# Patient Record
Sex: Male | Born: 1955 | Race: Black or African American | Hispanic: No | Marital: Married | Smoking: Never smoker
Health system: Southern US, Community
[De-identification: ages and names within clinical notes are randomized; demographics above are authoritative.]

## PROBLEM LIST (undated history)

## (undated) DIAGNOSIS — I10 Essential (primary) hypertension: Secondary | ICD-10-CM

## (undated) DIAGNOSIS — I639 Cerebral infarction, unspecified: Secondary | ICD-10-CM

## (undated) HISTORY — PX: NO PAST SURGERIES: SHX2092

## (undated) HISTORY — DX: Essential (primary) hypertension: I10

## (undated) HISTORY — DX: Cerebral infarction, unspecified: I63.9

---

## 2013-06-19 ENCOUNTER — Ambulatory Visit: Payer: Self-pay | Admitting: Family Medicine

## 2013-06-19 VITALS — BP 175/110 | HR 76 | Temp 98.2°F | Resp 16 | Ht 71.0 in | Wt 240.0 lb

## 2013-06-19 DIAGNOSIS — Z0289 Encounter for other administrative examinations: Secondary | ICD-10-CM

## 2013-06-19 DIAGNOSIS — I10 Essential (primary) hypertension: Secondary | ICD-10-CM

## 2013-06-19 MED ORDER — CLONIDINE HCL 0.1 MG PO TABS: 0.1000 mg | ORAL_TABLET | Freq: Two times a day (BID) | ORAL | Status: DC

## 2013-06-19 NOTE — Progress Notes (Signed)
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  Objective:  Normal PE except the BP

## 2013-06-20 ENCOUNTER — Encounter: Payer: Self-pay | Admitting: Family Medicine

## 2013-06-20 ENCOUNTER — Ambulatory Visit (INDEPENDENT_AMBULATORY_CARE_PROVIDER_SITE_OTHER): Payer: Self-pay | Admitting: Family Medicine

## 2013-06-20 VITALS — BP 154/102 | HR 72 | Temp 98.1°F | Resp 16 | Ht 71.0 in | Wt 240.0 lb

## 2013-06-20 DIAGNOSIS — Z0289 Encounter for other administrative examinations: Secondary | ICD-10-CM

## 2013-06-20 DIAGNOSIS — I10 Essential (primary) hypertension: Secondary | ICD-10-CM | POA: Insufficient documentation

## 2013-06-20 NOTE — Progress Notes (Signed)
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 He was given clonidine which he's taken.  Objective: Blood pressure on recheck today was 138/89  Assessment: Patient passes for one year

## 2013-06-24 ENCOUNTER — Encounter: Payer: Self-pay | Admitting: Family Medicine

## 2014-01-04 ENCOUNTER — Ambulatory Visit (INDEPENDENT_AMBULATORY_CARE_PROVIDER_SITE_OTHER): Payer: 59 | Admitting: Neurology

## 2014-01-04 ENCOUNTER — Encounter: Payer: Self-pay | Admitting: Neurology

## 2014-01-04 VITALS — BP 144/100 | HR 76 | Temp 97.2°F | Ht 70.0 in | Wt 242.2 lb

## 2014-01-04 DIAGNOSIS — I619 Nontraumatic intracerebral hemorrhage, unspecified: Secondary | ICD-10-CM

## 2014-01-04 NOTE — Progress Notes (Signed)
NEUROLOGY CONSULTATION NOTE  Jordan Logan MRN: [id number] DOB: 1956-01-04  Referring provider: Dr. Terrace Arabia Primary care provider: Dr. Terrace Arabia  Reason for consult:  Recent stroke  Dear Dr Lonzo Cloud:  Thank you for your kind referral of Jordan Logan for consultation of the above symptoms. Although his history is well known to you, please allow me to reiterate it for the purpose of our medical record. The patient was accompanied to the clinic by his girlfriend who also provides collateral information.   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  He stood up and noticed he could not walk well with his right leg.  He denied clear weakness, numbness, or heaviness of the right leg, just that "it wasn't right."  He also had some right facial weakness that has improved.  He felt a little out of breath.  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  Records from hospitalization unavailable for review.  He was discharged on Lisinopril, which her reports he had ran out of prior to the stroke.  Since then, he feels 95% better, with only tingling in the tips of the 2nd and 3rd digits of the right hand, and decreased strength in the right hand.    He denies any headaches, nausea, vomiting, diplopia, dysarthria, dysphagia, neck/back pain, bowel/bladder dysfunction.  He has intermittent right knee pain.  His BP today is 144/100, which concerns him because Lisinopril dose was increased a few days ago. There is a family history of stroke in his father and paternal uncle, hypertension on his mother side.   Records and images were personally reviewed where  available.   PAST MEDICAL HISTORY: Past Medical History  Diagnosis Date  . Hypertension   . Stroke     PAST SURGICAL HISTORY: History reviewed. No pertinent past surgical history.  MEDICATIONS: Lisinopril/HCTZ 20/12.5mg  BID   ALLERGIES: No Known Allergies  FAMILY HISTORY: Family History  Problem Relation Age of Onset  . Lupus Mother     SOCIAL HISTORY: History   Social History  . Marital Status: Legally Separated    Spouse Name: N/A    Number of Children: N/A  . Years of Education: N/A   Occupational History  . Not on file.   Social History Main Topics  . Smoking status: Never Smoker   . Smokeless tobacco: Not on file  . Alcohol Use: Yes  . Drug Use: No  . Sexual Activity: Not on file   Other Topics Concern  . Not on file   Social History Narrative  . No narrative on file    REVIEW OF SYSTEMS: Constitutional: No fevers, chills, or sweats, no generalized fatigue, change in appetite Eyes: No visual changes, double vision, eye pain Ear, nose and throat: No hearing loss, ear pain, nasal congestion, sore throat Cardiovascular: No chest pain, palpitations Respiratory:  No shortness of breath at rest or with exertion, wheezes GastrointestinaI: No nausea, vomiting, diarrhea, abdominal pain, fecal incontinence Genitourinary:  No dysuria, urinary retention or frequency Musculoskeletal:  No neck pain, back pain Integumentary: No rash, pruritus, skin lesions Neurological: as above Psychiatric: No depression, insomnia, anxiety Endocrine: No palpitations, fatigue, diaphoresis, mood swings, change in  appetite, change in weight, increased thirst Hematologic/Lymphatic:  No anemia, purpura, petechiae. Allergic/Immunologic: no itchy/runny eyes, nasal congestion, recent allergic reactions, rashes  PHYSICAL EXAM: Filed Vitals:   01/04/14 1235  BP: 144/100  Pulse: 76  Temp: 97.2 F (36.2 C)   General: No acute distress Head:  Normocephalic/atraumatic Neck:  supple, no paraspinal tenderness, full range of motion Back: No paraspinal tenderness Heart: regular rate and rhythm Lungs: Clear to auscultation bilaterally. Vascular: No carotid bruits. Skin/Extremities: No rash, no edema Neurological Exam: Mental status: alert and oriented to person, place, and time, no dysarthria or dysphagia, Fund of knowledge is appropriate.  Recent and remote memory are intact.  Attention and concentration are normal.    Able to name objects and repeat phrases. Cranial nerves: CN I: not tested CN II: pupils equal, round and reactive to light, visual fields intact, fundi unremarkable. CN III, IV, VI:  full range of motion, no nystagmus, no ptosis CN V: facial sensation intact CN VII: upper and lower face symmetric CN VIII: hearing intact CN IX, X: gag intact, uvula midline CN XI: sternocleidomastoid and trapezius muscles intact CN XII: tongue midline Bulk & Tone: normal, no fasciculations. Motor: 5/5 throughout except for decreased fine finger movements on the right hand.  No pronator drift. Sensation: decreased cold on the right LE, intact to pin, vibration and joint position sense.  No extinction to double simultaneous stimulation.  Romberg test negative Deep Tendon Reflexes: +1 both UE, +2 right patella, +1 left patella, +2 bilateral ankle jerks.  No ankle clonus Plantar responses: downgoing bilaterally Finger to nose testing: no incoordination Gait: narrow-based and steady, able to tandem walk adequately.  IMPRESSION: This is a pleasant 58 year old old-handed man with a history of hypertension and recent left basal ganglia hemorrhage on 12/15/13 with residual minimal right hand weakness and tingling.  [facility] requested for review.  He is not on lipid lowering agents, presumably levels were normal, however records unavailable at this time.  We discussed the importance of BP control, he will be getting a home BP monitor today, goal <140/90.   A follow-up head CT without contrast will be ordered in 2 weeks.  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  He will be speaking to the DOT and to his HR personnel about this.  He will follow-up in 3 months.  Thank you for allowing me to participate in the care of this patient. Please do not hesitate to call for any questions or concerns.   Patrcia DollyKaren Debra Colon, M.D.  CC: Dr. Terrace ArabiaIbtehal Shamleffer

## 2014-01-04 NOTE — Patient Instructions (Addendum)
1. Schedule repeat head CT without contrast in 2 weeks March 11 @2 :30 arrive 15 minutes prior [id number] 2. Obtain BP monitor and monitor pressures daily, goal is less than 140/90 3. Per CDL driving restrictions, no driving for 1 year after stroke 4. Follow-up in 3 months

## 2014-01-05 ENCOUNTER — Encounter: Payer: Self-pay | Admitting: Neurology

## 2014-01-11 ENCOUNTER — Ambulatory Visit
Admission: RE | Admit: 2014-01-11 | Discharge: 2014-01-11 | Disposition: A | Discharge status: Home / Self Care | Payer: 59 | Source: Ambulatory Visit | Attending: Internal Medicine | Admitting: Internal Medicine

## 2014-01-11 ENCOUNTER — Other Ambulatory Visit: Payer: Self-pay | Admitting: Internal Medicine

## 2014-01-11 DIAGNOSIS — R059 Cough, unspecified: Secondary | ICD-10-CM

## 2014-01-11 DIAGNOSIS — R05 Cough: Secondary | ICD-10-CM

## 2014-01-18 ENCOUNTER — Ambulatory Visit (HOSPITAL_COMMUNITY): Admission: RE | Admit: 2014-01-18 | Payer: 59 | Source: Ambulatory Visit

## 2014-06-27 ENCOUNTER — Encounter (HOSPITAL_COMMUNITY): Payer: Self-pay | Admitting: Emergency Medicine

## 2014-06-27 ENCOUNTER — Emergency Department (HOSPITAL_COMMUNITY): Payer: 59

## 2014-06-27 ENCOUNTER — Emergency Department (HOSPITAL_COMMUNITY)
Admission: EM | Admit: 2014-06-27 | Discharge: 2014-06-27 | Disposition: A | Discharge status: Home / Self Care | Payer: 59 | Attending: Emergency Medicine | Admitting: Emergency Medicine

## 2014-06-27 ENCOUNTER — Ambulatory Visit (INDEPENDENT_AMBULATORY_CARE_PROVIDER_SITE_OTHER): Payer: 59 | Admitting: Family Medicine

## 2014-06-27 VITALS — BP 186/92 | HR 71 | Temp 97.7°F | Resp 16

## 2014-06-27 DIAGNOSIS — Z8673 Personal history of transient ischemic attack (TIA), and cerebral infarction without residual deficits: Secondary | ICD-10-CM

## 2014-06-27 DIAGNOSIS — Z79899 Other long term (current) drug therapy: Secondary | ICD-10-CM | POA: Insufficient documentation

## 2014-06-27 DIAGNOSIS — I10 Essential (primary) hypertension: Secondary | ICD-10-CM | POA: Insufficient documentation

## 2014-06-27 DIAGNOSIS — Z7982 Long term (current) use of aspirin: Secondary | ICD-10-CM | POA: Insufficient documentation

## 2014-06-27 DIAGNOSIS — R209 Unspecified disturbances of skin sensation: Secondary | ICD-10-CM | POA: Insufficient documentation

## 2014-06-27 DIAGNOSIS — R200 Anesthesia of skin: Secondary | ICD-10-CM

## 2014-06-27 DIAGNOSIS — R202 Paresthesia of skin: Secondary | ICD-10-CM

## 2014-06-27 LAB — CBC WITH DIFFERENTIAL/PLATELET
Basophils Absolute: 0 10*3/uL (ref 0.0–0.1)
Basophils Relative: 1 % (ref 0–1)
EOS ABS: 0.1 10*3/uL (ref 0.0–0.7)
Eosinophils Relative: 2 % (ref 0–5)
HCT: 43 % (ref 39.0–52.0)
Hemoglobin: 15 g/dL (ref 13.0–17.0)
Lymphocytes Relative: 41 % (ref 12–46)
Lymphs Abs: 1.3 10*3/uL (ref 0.7–4.0)
MCH: 30.7 pg (ref 26.0–34.0)
MCHC: 34.9 g/dL (ref 30.0–36.0)
MCV: 88.1 fL (ref 78.0–100.0)
Monocytes Absolute: 0.2 10*3/uL (ref 0.1–1.0)
Monocytes Relative: 7 % (ref 3–12)
NEUTROS PCT: 51 % (ref 43–77)
Neutro Abs: 1.6 10*3/uL — ABNORMAL LOW (ref 1.7–7.7)
PLATELETS: 171 10*3/uL (ref 150–400)
RBC: 4.88 MIL/uL (ref 4.22–5.81)
RDW: 14.2 % (ref 11.5–15.5)
WBC: 3.2 10*3/uL — ABNORMAL LOW (ref 4.0–10.5)

## 2014-06-27 LAB — BASIC METABOLIC PANEL
Anion gap: 12 (ref 5–15)
BUN: 10 mg/dL (ref 6–23)
CALCIUM: 9.4 mg/dL (ref 8.4–10.5)
CO2: 27 mEq/L (ref 19–32)
Chloride: 102 mEq/L (ref 96–112)
Creatinine, Ser: 0.87 mg/dL (ref 0.50–1.35)
GFR calc non Af Amer: >90 mL/min (ref >90)
Glucose, Bld: 93 mg/dL (ref 70–99)
POTASSIUM: 4 meq/L (ref 3.7–5.3)
SODIUM: 141 meq/L (ref 137–147)

## 2014-06-27 MED ORDER — LISINOPRIL 10 MG PO TABS: 10.0000 mg | ORAL_TABLET | Freq: Every day | ORAL | Status: DC

## 2014-06-27 NOTE — ED Provider Notes (Signed)
CSN: [id number]     Arrival date & time 06/27/14  1143 History   First MD Initiated Contact with Patient 06/27/14 1151     Chief Complaint  Patient presents with  . Right hand tingling       HPI  Patient presents with some tingling in his right hand. He has a history of stroke in February of this year. Use in [place] when it happened. Dated 3 days. Has a right upper extremity weakness and tingling. Has right facial droop. His symptoms resolved and he has reached baseline and within a month as his initial insult. This morning and yesterday felt some tingling in his right index and middle fingers on the very tip. Difficulty with use or clumsiness of the hand. No facial droop. No speech or swallowing difficulties. He has been off his aspirin for 3 weeks of his lisinopril for 10 days.  Past Medical History  Diagnosis Date  . Hypertension   . Stroke    History reviewed. No pertinent past surgical history. Family History  Problem Relation Age of Onset  . Lupus Mother    History  Substance Use Topics  . Smoking status: Never Smoker   . Smokeless tobacco: Not on file  . Alcohol Use: Yes    Review of Systems  Constitutional: Negative for fever, chills, diaphoresis, appetite change and fatigue.  HENT: Negative for mouth sores, sore throat and trouble swallowing.   Eyes: Negative for visual disturbance.  Respiratory: Negative for cough, chest tightness, shortness of breath and wheezing.   Cardiovascular: Negative for chest pain.  Gastrointestinal: Negative for nausea, vomiting, abdominal pain, diarrhea and abdominal distention.  Endocrine: Negative for polydipsia, polyphagia and polyuria.  Genitourinary: Negative for dysuria, frequency and hematuria.  Musculoskeletal: Negative for gait problem.  Skin: Negative for color change, pallor and rash.  Neurological: Negative for dizziness, syncope, light-headedness and headaches.       Tingling right fingertips  Hematological: Does not  bruise/bleed easily.  Psychiatric/Behavioral: Negative for behavioral problems and confusion.      Allergies  Review of patient's allergies indicates no known allergies.  Home Medications   Prior to Admission medications   Medication Sig Start Date End Date Taking? Authorizing Provider  lisinopril (PRINIVIL,ZESTRIL) 10 MG tablet Take 1 tablet (10 mg total) by mouth daily. 06/27/14   Rolland Porter, MD  lisinopril (PRINIVIL,ZESTRIL) 20 MG tablet Take 20 mg by mouth 2 (two) times daily.    Historical Provider, MD   BP 151/96  Pulse 65  Temp(Src) 97.4 F (36.3 C) (Oral)  Resp 16  SpO2 98% Physical Exam  Constitutional: He is oriented to person, place, and time. He appears well-developed and well-nourished. No distress.  HENT:  Head: Normocephalic.  Eyes: Conjunctivae are normal. Pupils are equal, round, and reactive to light. No scleral icterus.  Neck: Normal range of motion. Neck supple. No thyromegaly present.  Cardiovascular: Normal rate and regular rhythm.  Exam reveals no gallop and no friction rub.   No murmur heard. Pulmonary/Chest: Effort normal and breath sounds normal. No respiratory distress. He has no wheezes. He has no rales.  Abdominal: Soft. Bowel sounds are normal. He exhibits no distension. There is no tenderness. There is no rebound.  Musculoskeletal: Normal range of motion.  Neurological: He is alert and oriented to person, place, and time.  Normal symmetric Strength to shoulder shrug, triceps, biceps, grip,wrist flex/extend,and intrinsics  Norma lsymmetric sensation above and below clavicles, and to all distributions to UEs. Norma symmetric strength to  flex/.extend hip and knees, dorsi/plantar flex ankles. Normal symmetric sensation to all distributions to LEs Patellar and achilles reflexes 1-2+. Downgoing Babinski   Skin: Skin is warm and dry. No rash noted.  Psychiatric: He has a normal mood and affect. His behavior is normal.    ED Course  Procedures  (including critical care time) Labs Review Labs Reviewed  CBC WITH DIFFERENTIAL - Abnormal; Notable for the following:    WBC 3.2 (*)    Neutro Abs 1.6 (*)    All other components within normal limits  BASIC METABOLIC PANEL    Imaging Review Mr Brain Wo Contrast  06/27/2014   CLINICAL DATA:  Right arm numbness.  History of stroke.  EXAM: MRI HEAD WITHOUT CONTRAST  TECHNIQUE: Multiplanar, multiecho pulse sequences of the brain and surrounding structures were obtained without intravenous contrast.  COMPARISON:  None.  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 No significant encephalomalacia. No other areas of hemorrhage  Several very small white matter hyperintensities bilaterally consistent with mild chronic microvascular ischemia. Brainstem and cerebellum intact  Negative for acute ischemic infarction.  Negative for mass  Ventricle size is normal.  Cerebral volume is normal.  IMPRESSION: Chronic hemorrhage left posterior putamen. 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 No acute infarct.   Electronically Signed   By: Marlan Palauharles  Clark M.D.   On: 06/27/2014 13:56     EKG Interpretation   Date/Time:  Tuesday June 27 2014 11:47:58 EDT Ventricular Rate:  69 PR Interval:  149 QRS Duration: 102 QT Interval:  420 QTC Calculation: 450 R Axis:   -35 Text Interpretation:  Sinus rhythm Left axis deviation slow R progression  Confirmed by Fayrene FearingJAMES  MD, Alaynah Schutter ([id number]) on 06/27/2014 12:03:45 PM      MDM   Final diagnoses:  Tingling in extremities    MRI show signs of old hemorrhagic infarct. No acute abnormalities. She is appropriate for outpatient treatment. Will be placed on a 10 mg lisinopril dose. Continue on 81 mg aspirin. Primary care followup. Diet and suicide. Avoid salt. Recheck here with any worsening symptoms.   Rolland PorterMark Lashawnta Burgert, MD 06/27/14  1520

## 2014-06-27 NOTE — ED Notes (Signed)
Dr. James at bedside  

## 2014-06-27 NOTE — ED Notes (Signed)
VSS. Pt in good condition to go home. Pt given discharge instructions and prescriptions were reviewed. All questions answered. Pt ambulatory on discharge and refused wheelchair.

## 2014-06-27 NOTE — Progress Notes (Signed)
History of Present Illness   Patient Identification Jordan Logan is a 58 y.o. male.  Patient information was obtained from patient. History/Exam limitations: none.  Chief Complaint  Numbness   Patient presents for evaluation of paresthesias down the R arm, similar to previous Sx from his preivous Hemorrhagic CVA about 4-5 months ago now.  He is a Naval architecttruck driver and was seen at a hospital in [place] for this with f/[facility] here in [place].. Onset of symptoms was sudden, not related to any specific activity. Symptoms are currently of mild severity. Symptoms have lasted 2 hours.  Stroke risk factors include hx ot Hemorrhagic CVA. Prior stroke history: yes: hemorrhagic left basal ganglia hemorrhagic CVA. Patient also complains of none. Patient denies chest pain, palpitations, seizures and shortness of breath.   Past Medical History  Diagnosis Date  . Hypertension   . Stroke    Family History  Problem Relation Age of Onset  . Lupus Mother    Current Outpatient Prescriptions  Medication Sig Dispense Refill  . lisinopril (PRINIVIL,ZESTRIL) 20 MG tablet Take 20 mg by mouth 2 (two) times daily.       No current facility-administered medications for this visit.   No Known Allergies History   Social History  . Marital Status: Legally Separated    Spouse Name: N/A    Number of Children: N/A  . Years of Education: N/A   Occupational History  . Not on file.   Social History Main Topics  . Smoking status: Never Smoker   . Smokeless tobacco: Not on file  . Alcohol Use: Yes  . Drug Use: No  . Sexual Activity: Not on file   Other Topics Concern  . Not on file   Social History Narrative  . No narrative on file   Review of Systems Constitutional: negative Respiratory: negative Cardiovascular: negative Gastrointestinal: negative Musculoskeletal:negative Neurological: positive for paresthesia Behavioral/Psych: negative   Physical Exam   BP 186/92   Pulse 71  Temp(Src) 97.7 F (36.5 C) (Oral)  Resp 16  SpO2 98% BP 186/92  Pulse 71  Temp(Src) 97.7 F (36.5 C) (Oral)  Resp 16  SpO2 98%  General Appearance:    Alert, cooperative, no distress, appears stated age  Head:    Normocephalic, without obvious abnormality, atraumatic  Eyes:    PERRL, conjunctiva/corneas clear, EOM's intact, fundi    benign, both eyes       Ears:    Normal TM's and external ear canals, both ears  Nose:   Nares normal, septum midline, mucosa normal, no drainage    or sinus tenderness  Throat:   Lips, mucosa, and tongue normal; teeth and gums normal  Lungs:     Clear to auscultation bilaterally, respirations unlabored  Chest wall:    No tenderness or deformity  Heart:    Regular rate and rhythm, S1 and S2 normal, no murmur, rub   or gallop  Abdomen:     Soft, non-tender, bowel sounds active all four quadrants,    no masses, no organomegaly  Extremities:   Extremities normal, atraumatic, no cyanosis or edema  Pulses:   2+ and symmetric all extremities  Skin:   Skin color, texture, turgor normal, no rashes or lesions  Lymph nodes:   Cervical, supraclavicular, and axillary nodes normal  Neurologic:   CNII-XII intact. Normal strength, sensation and reflexes      Throughout.  MS 5/5 UE/LE.  DTR + 2/4 B/L    Clinic Course  1) hx of CVA, hemorrhagic w/ acute paresthesias - Pt paresthesias similar to previous, and his neuro exam is completely benign today.  However, w/ his previous CVA hx w/in the past 6 months, will send to ED via ambulance for emergent CT head to r/o acute bleed and further CVA w/u.  He is on Lisinopril at home for HTN control.    Dr. Clelia Croft has seen and evaluated.  Twana First Candee Hoon, DO of [facility] 06/27/2014, 11:30 AM

## 2014-06-27 NOTE — ED Notes (Addendum)
Per EMS, pt comes from PCP office with c/o right hand tingling. Pt A&OX4. Pt has h/o CVA in Feb this year, no deficits. Pt denies chest pain, SOB or n/v/d. Pt denies blurred vision, slurred speech or weakness in other parts of his body. Pt has h/o hypertension, non compliant with taking Lisinopril. VSS: BP 160/110, P60, R16, O2 96%, and CBG 90.

## 2014-06-27 NOTE — Discharge Instructions (Signed)
Yor MRI today shows signs of the old stroke.  However, no new stroke was seen. Take 81 mg aspirin daily. Take your Lisinopril 10mg  daily.  Paresthesia Paresthesia is a burning or prickling feeling. This feeling can happen in any part of the body. It often happens in the hands, arms, legs, or feet. HOME CARE  Avoid drinking alcohol.  Try massage or needle therapy (acupuncture) to help with your problems.  Keep all doctor visits as told. GET HELP RIGHT AWAY IF:   You feel weak.  You have trouble walking or moving.  You have problems speaking or seeing.  You feel confused.  You cannot control when you poop (bowel movement) or pee (urinate).  You lose feeling (numbness) after an injury.  You pass out (faint).  Your burning or prickling feeling gets worse when you walk.  You have pain, cramps, or feel dizzy.  You have a rash. MAKE SURE YOU:   Understand these instructions.  Will watch your condition.  Will get help right away if you are not doing well or get worse. Document Released: 10/09/2008 Document Revised: 01/19/2012 Document Reviewed: 07/18/2011 [facility] Patient Information 2015 [employer] [place]. This information is not intended to replace advice given to you by your health care provider. Make sure you discuss any questions you have with your health care provider.

## 2014-06-27 NOTE — Progress Notes (Signed)
   Subjective:    Patient ID: Jordan Logan, male    DOB: 03/07/1956, 58 y.o.   MRN: [id number] Chief Complaint  Patient presents with  . Numbness    hands hx of stroke     HPI   See resident note.  Pt had hemorrhagic stroke [place] - 6 mo prev w/ right hemeparesis inc slurred speech and facial weakness. All his sxs resolved and he has been at baseline controlled on lisinopril 40. Did not take his medication for the past few d.   This morning developed numbness and tingling in right 1st-3rd fingers - still persisting. When it happened it reminded  Past Medical History  Diagnosis Date  . Hypertension   . Stroke    Current Outpatient Prescriptions on File Prior to Visit  Medication Sig Dispense Refill  . lisinopril (PRINIVIL,ZESTRIL) 20 MG tablet Take 20 mg by mouth 2 (two) times daily.       No current facility-administered medications on file prior to visit.   No Known Allergies   Review of Systems  Constitutional: Negative for fever, chills, diaphoresis, activity change, appetite change, fatigue and unexpected weight change.  Eyes: Negative for visual disturbance.  Respiratory: Negative for chest tightness and shortness of breath.   Cardiovascular: Negative for chest pain and palpitations.  Neurological: Positive for weakness and numbness. Negative for headaches.       Objective:  BP 186/92  Pulse 71  Temp(Src) 97.7 F (36.5 C) (Oral)  Resp 16  SpO2 98%  Physical Exam  Constitutional: He is oriented to person, place, and time. He appears well-developed and well-nourished. No distress.  HENT:  Head: Normocephalic and atraumatic.  Right Ear: External ear normal.  Left Ear: External ear normal.  Nose: Nose normal.  Mouth/Throat: Oropharynx is clear and moist. No oropharyngeal exudate.  Eyes: Conjunctivae are normal. Pupils are equal, round, and reactive to light. Right eye exhibits no discharge. Left eye exhibits no discharge. No scleral icterus.  Right eye exhibits normal extraocular motion and no nystagmus. Left eye exhibits normal extraocular motion and no nystagmus.  Neck: Normal range of motion. Neck supple. No thyromegaly present.  Cardiovascular: Normal rate, regular rhythm and normal heart sounds.   Pulmonary/Chest: Effort normal and breath sounds normal. No respiratory distress.  Lymphadenopathy:    He has no cervical adenopathy.  Neurological: He is alert and oriented to person, place, and time. Gait normal.  Skin: Skin is warm. He is not diaphoretic.  Psychiatric: He has a normal mood and affect. His behavior is normal.          Assessment & Plan:  Pt transferred by EMS to ER due to hypertension energy and recurrence of stroke sxs with right finger numbness.  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

## 2014-06-27 NOTE — ED Notes (Signed)
Patient transported to MRI 

## 2014-07-04 ENCOUNTER — Ambulatory Visit: Payer: 59

## 2014-07-06 ENCOUNTER — Emergency Department (HOSPITAL_COMMUNITY)
Admission: EM | Admit: 2014-07-06 | Discharge: 2014-07-06 | Disposition: A | Discharge status: Home / Self Care | Payer: 59 | Attending: Emergency Medicine | Admitting: Emergency Medicine

## 2014-07-06 ENCOUNTER — Encounter (HOSPITAL_COMMUNITY): Payer: Self-pay | Admitting: Emergency Medicine

## 2014-07-06 DIAGNOSIS — L0291 Cutaneous abscess, unspecified: Secondary | ICD-10-CM

## 2014-07-06 DIAGNOSIS — Z8673 Personal history of transient ischemic attack (TIA), and cerebral infarction without residual deficits: Secondary | ICD-10-CM | POA: Insufficient documentation

## 2014-07-06 DIAGNOSIS — I10 Essential (primary) hypertension: Secondary | ICD-10-CM | POA: Insufficient documentation

## 2014-07-06 DIAGNOSIS — L02219 Cutaneous abscess of trunk, unspecified: Secondary | ICD-10-CM | POA: Insufficient documentation

## 2014-07-06 DIAGNOSIS — L03319 Cellulitis of trunk, unspecified: Principal | ICD-10-CM

## 2014-07-06 DIAGNOSIS — Z79899 Other long term (current) drug therapy: Secondary | ICD-10-CM | POA: Insufficient documentation

## 2014-07-06 MED ORDER — LIDOCAINE HCL (PF) 1 % IJ SOLN
5.0000 mL | Freq: Once | INTRAMUSCULAR | Status: AC
  Administered 2014-07-06: 5 mL

## 2014-07-06 NOTE — ED Notes (Signed)
The patient said he has had this knot for about four years.  He said last year it got big and had pus coming out of it so he squeezed it out and it got better.  He said it started to flare up again about a week ago.

## 2014-07-06 NOTE — ED Provider Notes (Signed)
Medical screening examination/treatment/procedure(s) were performed by non-physician practitioner and as supervising physician I was immediately available for consultation/collaboration.   EKG Interpretation None        Elwin Mocha, MD 07/06/14 2354

## 2014-07-06 NOTE — ED Provider Notes (Signed)
CSN: [id number]     Arrival date & time 07/06/14  2029 History   None    Chief Complaint  Patient presents with  . Abscess    The patient said he has had this knot for about four years.  He said last year it got big and had pus coming out of it so he squeezed it out and it got better.     (Consider location/radiation/quality/duration/timing/severity/associated sxs/prior Treatment) HPI Comments: Patient with an abscess.  He was left lower flank he is been recurrent in the same area.  He notes for the past, week it is larger and more painful  Patient is a 58 y.o. male presenting with abscess. The history is provided by the patient.  Abscess Location:  Torso Torso abscess location:  L flank Abscess quality: fluctuance, painful and redness   Red streaking: no   Duration:  1 week Progression:  Worsening Pain details:    Severity:  Moderate   Duration:  1 week   Timing:  Constant   Progression:  Worsening Chronicity:  New Relieved by:  Nothing Worsened by:  Nothing tried Associated symptoms: no fever     Past Medical History  Diagnosis Date  . Hypertension   . Stroke    History reviewed. No pertinent past surgical history. Family History  Problem Relation Age of Onset  . Lupus Mother    History  Substance Use Topics  . Smoking status: Never Smoker   . Smokeless tobacco: Not on file  . Alcohol Use: Yes    Review of Systems  Constitutional: Negative for fever.  Skin: Positive for wound.  Neurological: Negative for dizziness.  All other systems reviewed and are negative.     Allergies  Review of patient's allergies indicates no known allergies.  Home Medications   Prior to Admission medications   Medication Sig Start Date End Date Taking? Authorizing Provider  lisinopril (PRINIVIL,ZESTRIL) 10 MG tablet Take 1 tablet (10 mg total) by mouth daily. 06/27/14   Rolland Porter, MD  lisinopril (PRINIVIL,ZESTRIL) 20 MG tablet Take 20 mg by mouth 2 (two) times daily.     Historical Provider, MD   BP 145/107  Pulse 70  Temp(Src) 98 F (36.7 C) (Oral)  Resp 18  Ht 5' 10.5" (1.791 m)  Wt 228 lb (103.42 kg)  BMI 32.24 kg/m2  SpO2 97% Physical Exam  Nursing note and vitals reviewed. Constitutional: He is oriented to person, place, and time. He appears well-developed.  HENT:  Head: Normocephalic.  Eyes: Pupils are equal, round, and reactive to light.  Neck: Normal range of motion.  Cardiovascular: Normal rate.   Pulmonary/Chest: Effort normal.  Musculoskeletal: Normal range of motion.  Neurological: He is alert and oriented to person, place, and time.  Skin: Skin is warm.       ED Course  INCISION AND DRAINAGE Date/Time: 07/06/2014 9:24 PM Performed by: Arman Filter Authorized by: Arman Filter Consent: Verbal consent obtained. written consent not obtained. Risks and benefits: risks, benefits and alternatives were discussed Consent given by: patient Patient understanding: patient states understanding of the procedure being performed Patient identity confirmed: verbally with patient Type: abscess Body area: trunk Anesthesia: local infiltration Local anesthetic: lidocaine 2% with epinephrine Anesthetic total: 2 ml Patient sedated: no Scalpel size: 11 Needle gauge: 22 Incision type: single straight Complexity: simple Drainage: purulent Drainage amount: copious Wound treatment: wound left open Patient tolerance: Patient tolerated the procedure well with no immediate complications.   (including critical care  time) Labs Review Labs Reviewed - No data to display  Imaging Review No results found.   EKG Interpretation None      MDM   Final diagnoses:  Abscess         Arman Filter, NP 07/06/14 2126

## 2014-07-06 NOTE — Discharge Instructions (Signed)
Abscess Care After An abscess (also called a boil or furuncle) is an infected area that contains a collection of pus. Signs and symptoms of an abscess include pain, tenderness, redness, or hardness, or you may feel a moveable soft area under your skin. An abscess can occur anywhere in the body. The infection may spread to surrounding tissues causing cellulitis. A cut (incision) by the surgeon was made over your abscess and the pus was drained out. Gauze may have been packed into the space to provide a drain that will allow the cavity to heal from the inside outwards. The boil may be painful for 5 to 7 days. Most people with a boil do not have high fevers. Your abscess, if seen early, may not have localized, and may not have been lanced. If not, another appointment may be required for this if it does not get better on its own or with medications. HOME CARE INSTRUCTIONS   Only take over-the-counter or prescription medicines for pain, discomfort, or fever as directed by your caregiver.  When you bathe, soak and then remove gauze or iodoform packs at least daily or as directed by your caregiver. You may then wash the wound gently with mild soapy water. Repack with gauze or do as your caregiver directs. SEEK IMMEDIATE MEDICAL CARE IF:   You develop increased pain, swelling, redness, drainage, or bleeding in the wound site.  You develop signs of generalized infection including muscle aches, chills, fever, or a general ill feeling.  An oral temperature above 102 F (38.9 C) develops, not controlled by medication. See your caregiver for a recheck if you develop any of the symptoms described above. If medications (antibiotics) were prescribed, take them as directed. Document Released: 05/15/2005 Document Revised: 01/19/2012 Document Reviewed: 01/10/2008 [facility] Patient Information 2015 [place], [place]. This information is not intended to replace advice given to you by your health care provider. Make sure  you discuss any questions you have with your health care provider. As the outer dressing as needed.  For the next 24-48 hours as it will drain some bloody, purulent material.  He did apply a warm compress to the area 2 or 3 times a day for the next 2 days to assist with drainage

## 2014-09-26 ENCOUNTER — Telehealth: Payer: Self-pay | Admitting: Neurology

## 2014-09-26 NOTE — Telephone Encounter (Signed)
Pt called wanting to know if Dr. Karel JarvisAquino can write him a note to go back to work. C/B [phone]

## 2014-09-26 NOTE — Telephone Encounter (Signed)
Returned call to patient. Did advise that he needed an ov before return to work can be addressed since he was last seen in Feb & was supposed to have a 3 mth f/u after that. Patient has been scheduled for 12/28.

## 2014-11-06 ENCOUNTER — Ambulatory Visit (INDEPENDENT_AMBULATORY_CARE_PROVIDER_SITE_OTHER): Payer: Self-pay | Admitting: Neurology

## 2014-11-06 ENCOUNTER — Encounter: Payer: Self-pay | Admitting: Neurology

## 2014-11-06 VITALS — BP 148/92 | HR 86 | Resp 16 | Ht 71.0 in | Wt 226.0 lb

## 2014-11-06 DIAGNOSIS — I610 Nontraumatic intracerebral hemorrhage in hemisphere, subcortical: Secondary | ICD-10-CM

## 2014-11-06 NOTE — Progress Notes (Signed)
NEUROLOGY FOLLOW UP OFFICE NOTE  Jordan Logan [id number]  HISTORY OF PRESENT ILLNESS: I had the pleasure of seeing Jordan Logan in follow-up in the neurology clinic on 11/06/2014.  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 Symptoms resolved after around a month. 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 He reports only one instance of tingling in the right index and middle fingers and some clumsiness with his right hand last 06/27/14. At that time he had missed doses of aspirin and Lisinopril. He went to his PCP and then to the ER, BP was 151/96, MRI brain was done which I personally reviewed showed chronic hemorrhage in the left posterior putamen, no acute infarct. Once he was told there was no stroke, he reports that the symptoms resolved and have not recurred since. He denies any focal numbness/tingling/weakness since then, no headaches, dizziness, diplopia, dysarthria, dysphagia, neck/back pain, bowel/bladder dysfunction.   HPI: This is a 58 yo RH man with a history of hypertension, who was in his usual state of health until 12/15/13 while at a truck stop, when he started having tingling in his right hand, radiating up to the shoulder. He stood up and noticed he could not walk well with his right leg. He denied clear weakness, numbness, or heaviness of the right leg, just that "it wasn't right." He also had some right facial weakness that had improved. He felt a little out of breath. He was admitted to [facility] in [place] and diagnosed with a left basal ganglia hemorrhagic stroke secondary to hypertension. Records from  hospitalization unavailable for review. He was discharged on Lisinopril, which her reports he had ran out of prior to the stroke. There is a family history of stroke in his father and paternal uncle, hypertension on his mother side.   PAST MEDICAL HISTORY: Past Medical History  Diagnosis Date  . Hypertension   . Stroke     MEDICATIONS: Lotrel 5-20mg  daily Aspirin 81mg  daily   No current facility-administered medications on file prior to visit.    ALLERGIES: No Known Allergies  FAMILY HISTORY: Family History  Problem Relation Age of Onset  . Lupus Mother     SOCIAL HISTORY: History   Social History  . Marital Status: Legally Separated    Spouse Name: N/A    Number of Children: N/A  . Years of Education: N/A   Occupational History  . Not on file.   Social History Main Topics  . Smoking status: Never Smoker   . Smokeless tobacco: Not on file  . Alcohol Use: Yes  . Drug Use: No  . Sexual Activity: Not on file   Other Topics Concern  . Not on file   Social History Narrative    REVIEW OF SYSTEMS: Constitutional: No fevers, chills, or sweats, no generalized fatigue, change in appetite Eyes: No visual changes, double vision, eye pain Ear, nose and throat: No hearing loss, ear pain, nasal congestion, sore throat Cardiovascular: No chest pain, palpitations Respiratory:  No shortness of breath at rest or with exertion, wheezes GastrointestinaI: No nausea, vomiting, diarrhea, abdominal pain, fecal incontinence Genitourinary:  No dysuria, urinary retention or frequency Musculoskeletal:  No neck pain, back pain Integumentary: No rash, pruritus, skin lesions Neurological: as above Psychiatric: No depression, insomnia, anxiety Endocrine: No palpitations, fatigue, diaphoresis, mood swings, change in appetite, change in weight, increased thirst Hematologic/Lymphatic:  No anemia, purpura, petechiae. Allergic/Immunologic: no itchy/runny eyes, nasal congestion, recent  allergic reactions, rashes  PHYSICAL EXAM: Filed Vitals:   11/06/14 1253  BP: 140/110  Pulse: 86  Resp: 16   General: No acute distress Head:  Normocephalic/atraumatic Neck: supple, no paraspinal tenderness, full range of motion Heart:  Regular rate and rhythm Lungs:  Clear to auscultation bilaterally Back: No paraspinal tenderness Skin/Extremities: No rash, no edema Neurological Exam: alert and oriented to person, place, and time. No aphasia or dysarthria. Fund of knowledge is appropriate.  Recent and remote memory are intact.  Attention and concentration are normal.    Able to name objects and repeat phrases. Cranial nerves: Pupils equal, round, reactive to light.  Fundoscopic exam unremarkable, no papilledema. Extraocular movements intact with no nystagmus. Visual fields full. Facial sensation intact. No facial asymmetry. Tongue, uvula, palate midline.  Motor: Bulk and tone normal, muscle strength 5/5 throughout with no pronator drift.  Sensation to light touch, temperature and vibration intact.  No extinction to double simultaneous stimulation.  Deep tendon reflexes 2+ throughout, toes downgoing.  Finger to nose testing intact.  Gait narrow-based and steady, able to tandem walk adequately.  Romberg negative.  IMPRESSION: This is a 58 yo RH man with hypertension and left basal ganglia hypertensive hemorrhagic stroke last 12/15/13. He reports symptoms resolved except for one episode of brief right-sided paresthesias with negative MRI brain last August 2015. He denies any recurrence of symptoms since then, neurological exam today is normal. His BP today is 148/92, he is on BP medication and reports monitoring BP at home. We discussed secondary stroke prevention with goal of keeping BP <130/90. Continue daily baby aspirin. 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 He is requesting for clearance, I discussed with him that CDL driver's license  requirements indicate that re-evaluation needs to be done by a DOT medical examiner 1 year after a stroke before clearance for driving. He reports that he has been driving a Class B vehicle. I have advised him to wait the 1 year (12/15/14) then follow-up with me, after which I will be happy to fill out forms for his DOT medical examiner if neurological exam remains normal. He will follow-up in 2 months.  Thank you for allowing me to participate in his care.  Please do not hesitate to call for any questions or concerns.  The duration of this appointment visit was 15 minutes of face-to-face time with the patient.  Greater than 50% of this time was spent in counseling, explanation of diagnosis, planning of further management, and coordination of care.   Patrcia DollyKaren Aquino, M.D.   CC: Dr. Lonzo CloudShamleffer

## 2014-11-06 NOTE — Patient Instructions (Signed)
1. Continue daily aspirin, BP control 2. Monitor BP daily at home at the same time 3. Start daily exercise 4. Follow-up in 2 months

## 2014-11-09 ENCOUNTER — Encounter: Payer: Self-pay | Admitting: Neurology

## 2015-01-08 ENCOUNTER — Telehealth: Payer: Self-pay | Admitting: Neurology

## 2015-01-08 NOTE — Telephone Encounter (Signed)
Pt called to cancel his f/u for 01/09/15. He did not give a reason.

## 2015-01-09 ENCOUNTER — Ambulatory Visit: Payer: Self-pay | Admitting: Neurology

## 2020-04-29 ENCOUNTER — Other Ambulatory Visit: Payer: Self-pay

## 2020-04-29 ENCOUNTER — Encounter (HOSPITAL_COMMUNITY): Payer: Self-pay | Admitting: Emergency Medicine

## 2020-04-29 ENCOUNTER — Inpatient Hospital Stay (HOSPITAL_COMMUNITY)
Admission: AD | Admit: 2020-04-29 | Discharge: 2020-05-01 | DRG: 304 | Disposition: A | Discharge status: Home / Self Care | Payer: Medicare (Managed Care) | Attending: Internal Medicine | Admitting: Internal Medicine

## 2020-04-29 ENCOUNTER — Emergency Department (HOSPITAL_COMMUNITY): Payer: Medicare (Managed Care)

## 2020-04-29 ENCOUNTER — Inpatient Hospital Stay (HOSPITAL_COMMUNITY): Payer: Medicare (Managed Care)

## 2020-04-29 DIAGNOSIS — H4902 Third [oculomotor] nerve palsy, left eye: Secondary | ICD-10-CM | POA: Diagnosis present

## 2020-04-29 DIAGNOSIS — I6389 Other cerebral infarction: Secondary | ICD-10-CM | POA: Diagnosis not present

## 2020-04-29 DIAGNOSIS — U071 COVID-19: Secondary | ICD-10-CM | POA: Diagnosis present

## 2020-04-29 DIAGNOSIS — Z9114 Patient's other noncompliance with medication regimen: Secondary | ICD-10-CM | POA: Diagnosis not present

## 2020-04-29 DIAGNOSIS — I169 Hypertensive crisis, unspecified: Secondary | ICD-10-CM | POA: Diagnosis present

## 2020-04-29 DIAGNOSIS — N179 Acute kidney failure, unspecified: Secondary | ICD-10-CM | POA: Diagnosis present

## 2020-04-29 DIAGNOSIS — N289 Disorder of kidney and ureter, unspecified: Secondary | ICD-10-CM

## 2020-04-29 DIAGNOSIS — I130 Hypertensive heart and chronic kidney disease with heart failure and stage 1 through stage 4 chronic kidney disease, or unspecified chronic kidney disease: Secondary | ICD-10-CM | POA: Diagnosis present

## 2020-04-29 DIAGNOSIS — Z8673 Personal history of transient ischemic attack (TIA), and cerebral infarction without residual deficits: Secondary | ICD-10-CM

## 2020-04-29 DIAGNOSIS — E876 Hypokalemia: Secondary | ICD-10-CM | POA: Diagnosis present

## 2020-04-29 DIAGNOSIS — I639 Cerebral infarction, unspecified: Secondary | ICD-10-CM | POA: Diagnosis present

## 2020-04-29 DIAGNOSIS — I160 Hypertensive urgency: Secondary | ICD-10-CM | POA: Diagnosis present

## 2020-04-29 DIAGNOSIS — N183 Chronic kidney disease, stage 3 unspecified: Secondary | ICD-10-CM | POA: Diagnosis present

## 2020-04-29 DIAGNOSIS — H532 Diplopia: Secondary | ICD-10-CM | POA: Diagnosis not present

## 2020-04-29 DIAGNOSIS — I674 Hypertensive encephalopathy: Secondary | ICD-10-CM | POA: Diagnosis present

## 2020-04-29 DIAGNOSIS — I5032 Chronic diastolic (congestive) heart failure: Secondary | ICD-10-CM | POA: Diagnosis present

## 2020-04-29 DIAGNOSIS — I444 Left anterior fascicular block: Secondary | ICD-10-CM | POA: Diagnosis present

## 2020-04-29 DIAGNOSIS — I10 Essential (primary) hypertension: Secondary | ICD-10-CM | POA: Diagnosis not present

## 2020-04-29 LAB — BASIC METABOLIC PANEL WITH GFR
Anion gap: 9 (ref 5–15)
BUN: 12 mg/dL (ref 8–23)
CO2: 27 mmol/L (ref 22–32)
Calcium: 8.6 mg/dL — ABNORMAL LOW (ref 8.9–10.3)
Chloride: 101 mmol/L (ref 98–111)
Creatinine, Ser: 1.35 mg/dL — ABNORMAL HIGH (ref 0.61–1.24)
GFR calc Af Amer: >60 mL/min (ref >60)
GFR calc non Af Amer: 55 mL/min — ABNORMAL LOW (ref >60)
Glucose, Bld: 113 mg/dL — ABNORMAL HIGH (ref 70–99)
Potassium: 3.4 mmol/L — ABNORMAL LOW (ref 3.5–5.1)
Sodium: 137 mmol/L (ref 135–145)

## 2020-04-29 LAB — CBC WITH DIFFERENTIAL/PLATELET
Abs Immature Granulocytes: 0.01 10*3/uL (ref 0.00–0.07)
Basophils Absolute: 0 10*3/uL (ref 0.0–0.1)
Basophils Relative: 1 %
Eosinophils Absolute: 0.1 10*3/uL (ref 0.0–0.5)
Eosinophils Relative: 2 %
HCT: 45.9 % (ref 39.0–52.0)
Hemoglobin: 15.4 g/dL (ref 13.0–17.0)
Immature Granulocytes: 0 %
Lymphocytes Relative: 36 %
Lymphs Abs: 1.7 10*3/uL (ref 0.7–4.0)
MCH: 30.7 pg (ref 26.0–34.0)
MCHC: 33.6 g/dL (ref 30.0–36.0)
MCV: 91.4 fL (ref 80.0–100.0)
Monocytes Absolute: 0.4 10*3/uL (ref 0.1–1.0)
Monocytes Relative: 8 %
Neutro Abs: 2.5 10*3/uL (ref 1.7–7.7)
Neutrophils Relative %: 53 %
Platelets: 178 10*3/uL (ref 150–400)
RBC: 5.02 MIL/uL (ref 4.22–5.81)
RDW: 15.5 % (ref 11.5–15.5)
WBC: 4.7 10*3/uL (ref 4.0–10.5)
nRBC: 0 % (ref 0.0–0.2)

## 2020-04-29 LAB — SARS CORONAVIRUS 2 BY RT PCR (HOSPITAL ORDER, PERFORMED IN ~~LOC~~ HOSPITAL LAB): SARS Coronavirus 2: POSITIVE — AB

## 2020-04-29 LAB — SEDIMENTATION RATE: Sed Rate: 7 mm/h (ref 0–16)

## 2020-04-29 LAB — C-REACTIVE PROTEIN: CRP: <0.5 mg/dL (ref <1.0)

## 2020-04-29 MED ORDER — ASPIRIN 300 MG RE SUPP
300.0000 mg | Freq: Every day | RECTAL | Status: DC
  Filled 2020-04-29: qty 1

## 2020-04-29 MED ORDER — ASPIRIN 325 MG PO TABS
325.0000 mg | ORAL_TABLET | Freq: Every day | ORAL | Status: DC
  Administered 2020-04-30 – 2020-05-01 (×2): 325 mg via ORAL
  Filled 2020-04-29 (×2): qty 1

## 2020-04-29 MED ORDER — ACETAMINOPHEN 160 MG/5ML PO SOLN: 650.0000 mg | ORAL | Status: DC | PRN

## 2020-04-29 MED ORDER — ENOXAPARIN SODIUM 40 MG/0.4ML ~~LOC~~ SOLN
40.0000 mg | Freq: Every day | SUBCUTANEOUS | Status: DC
  Filled 2020-04-29: qty 0.4

## 2020-04-29 MED ORDER — STROKE: EARLY STAGES OF RECOVERY BOOK
Freq: Once | Status: DC
  Filled 2020-04-29: qty 1

## 2020-04-29 MED ORDER — LABETALOL HCL 5 MG/ML IV SOLN
5.0000 mg | INTRAVENOUS | Status: DC | PRN
  Administered 2020-04-29: 5 mg via INTRAVENOUS
  Filled 2020-04-29: qty 4

## 2020-04-29 MED ORDER — POTASSIUM CHLORIDE CRYS ER 20 MEQ PO TBCR
40.0000 meq | EXTENDED_RELEASE_TABLET | Freq: Once | ORAL | Status: AC
  Administered 2020-04-30: 40 meq via ORAL
  Filled 2020-04-29: qty 2

## 2020-04-29 MED ORDER — ACETAMINOPHEN 650 MG RE SUPP: 650.0000 mg | RECTAL | Status: DC | PRN

## 2020-04-29 MED ORDER — SODIUM CHLORIDE (PF) 0.9 % IJ SOLN
INTRAMUSCULAR | Status: AC
  Filled 2020-04-29: qty 50

## 2020-04-29 MED ORDER — LABETALOL HCL 5 MG/ML IV SOLN
10.0000 mg | INTRAVENOUS | Status: DC | PRN
  Administered 2020-04-29 (×3): 10 mg via INTRAVENOUS
  Filled 2020-04-29 (×2): qty 4

## 2020-04-29 MED ORDER — GADOBUTROL 1 MMOL/ML IV SOLN
10.0000 mL | Freq: Once | INTRAVENOUS | Status: AC | PRN
  Administered 2020-04-29: 10 mL via INTRAVENOUS

## 2020-04-29 MED ORDER — IOHEXOL 350 MG/ML SOLN
80.0000 mL | Freq: Once | INTRAVENOUS | Status: AC | PRN
  Administered 2020-04-29: 80 mL via INTRAVENOUS

## 2020-04-29 MED ORDER — ATORVASTATIN CALCIUM 80 MG PO TABS
80.0000 mg | ORAL_TABLET | Freq: Every day | ORAL | Status: DC
  Filled 2020-04-29 (×2): qty 1

## 2020-04-29 MED ORDER — ACETAMINOPHEN 325 MG PO TABS: 650.0000 mg | ORAL_TABLET | ORAL | Status: DC | PRN

## 2020-04-29 NOTE — Progress Notes (Addendum)
Patient has 360$ on him. Will keep on him. Refuse to lock up. No change in patient status. No neurological deficit noted during the night. Denies any double or blurred vision. NIHSS 0.

## 2020-04-29 NOTE — TOC Initial Note (Signed)
Transition of Care [facility]) - Initial/Assessment Note    Patient Details  Name: Jordan Logan MRN: [id number] Date of Birth: 31-Jul-1956  [facility] CM/SW Contact:    Lockie Pares, RN Phone Number: 04/29/2020, 8:46 AM  Clinical Narrative:                 Was consulted by Lucien Mons to speak to patient  beacause he was in with a possible stroke and he was refusing care due to no insurance. Upon inspection of his records, he has insurance.. In speaking to the patient the real reason he wanted to speak to CM was that he felt that the communication was lacking. Everything was going too fast, and his major problem was not being addressed he felt, which was his blood pressure. He also did have concerns about cost and coverage with his insurance. I addressed these concerns with him, and also texted with Dr. Fredderick Phenix. It is important to him to be heard and to do things one at a time he is knowledgeable, and does want some control of the situation.He did state to me that he is taking care of his wife and before that his mother. He has been under some stress and has not been eating and exercising right like he had in the past.   I told him I would follow up to see how things were progressing. He stated he would stay and receive treatment. . We spoke about care for the caretaker. And to take time for hisself to destress.  Will continue to follow.    Barriers to Discharge: Continued Medical Work up   Patient Goals and CMS Choice Patient states their goals for this hospitalization and ongoing recovery are:: To take one step at a time      Expected Discharge Plan and Services                                                Prior Living Arrangements/Services                       Activities of Daily Living      Permission Sought/Granted                  Emotional Assessment              Admission diagnosis:  Hypertension, visual changes Patient Active  Problem List   Diagnosis Date Noted  . CVA (cerebrovascular accident due to intracerebral hemorrhage) (HCC) 01/04/2014  . Hypertension 06/20/2013   PCP:  Patient, No Pcp Per Pharmacy:   [facility] [address] ([address]), [place] - [address] [place] [address] Phone: [phone] Fax: [phone]     Social Determinants of Health (SDOH) Interventions    Readmission Risk Interventions No flowsheet data found.

## 2020-04-29 NOTE — ED Notes (Signed)
Patient transported to CT via stretcher.

## 2020-04-29 NOTE — ED Provider Notes (Signed)
For aPatient is a 64 year old male who was initially seen by Dr. Lynelle Doctor.  He had refused CT imaging.  He presented with a 3rd nerve palsy concerning for stroke or intracranial hemorrhage.  I did go back and talk to the patient and he at this point is amenable to CT imaging.  We did get a CT and a CTA which shows no aneurysm or obvious acute stroke/hemorrhage.  He was previously seen by telemetry neurology.  He is out of the window for TPA.  I spoke with Dr. Thornell Mule, the hospitalist on-call who will admit the patient.  He is going to touch base with the neurologist on call as well.  We will at this point allow for permissive hypertension.   Rolan Bucco, MD 04/29/20 [phone]

## 2020-04-29 NOTE — H&P (Signed)
History and Physical    Jordan Logan HTD:[id number] DOB: 01-02-1956 DOA: 04/29/2020  PCP: Patient, No Pcp Per Patient coming from: home  Chief Complaint: diplopia  HPI: Jordan Logan is a 64 y.o. 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  Patient states that yesterday he started with double vision that started yesterday when he was in a disagreement with his wife, around 3 pm.  He went to sleep and awoke early in the morning with unsteadiness and dizziness.  He likes to focus on diet and exercise to control his blood pressure.  He is somewhat adverse to medical care as his wife has lymphoma and "stage V CKD" and has been doing alright for 3 years.  We discussed that sometimes lifestyle changes can only go so far and with a genetic predisposition that many times requires medicine.  He agreed.  Denies recent illness.  He moved from [place] to [address] in May.  SH - has not been able to exercise recently, previously doing martial arts.  He states that his wife is sick.  He is currently on [employer]. FH-lupus in his mother  38 patient had an MRI of his brain when he was having right arm numbness showing a chronic hemorrhage of the left posterior putamen and appeared to be a chronic benign hypertensive hemorrhage with mild chronic microvascular ischemic change in the white matter but no acute infarct   In the emergency department: 97.8, HR 54, RR 15, quite hypertensive, 92% on room air.  WBC 4.7, Hgb 15.4, PLT 178, NA 137, K3.4, CO2 27, SCR 1.35, calcium 8.6.  EKG showed normal sinus rhythm with T wave inversions in the lateral leads that are not necessarily dynamic but are new as compared to 2015 EKG  Blood pressure was elevated to 185/125, at times 200 systolic, and he was given IV labetalol.   EKG "Sinus rhythm Left anterior  fascicular block Left ventricular hypertrophy No significant change since last tracing 27 Jun 2014 Confirmed by Devoria Albe ([id number]) on 04/29/2020 3:54:23 AM" Teleneurology consult was done recommending CTH, if normal to start ASA 325mg  and TTE and outpatient follow up with neurology He has been adament not to get tests done and social work was consulted for assistance.  Finally, he is able to get the CT scan of his head done per stroke protocol and it showed remote lacunar infarcts and microvascular changes.  He will be transferred to [facility] if he agrees for an MRI and neurological assistance  Patient was found to be covid positive   Review of Systems: As per HPI otherwise 10 point review of systems negative.  Other pertinents as below:  General - pt denies a headache, hasn't bene losin gweight unintentionally HEENT - visual changes as per hpi, had some dizziness and unsteadiness Cardio - denies CP or palpitations Resp - denies SOB, cough GI - denies n/v/d/GI pain,  GU - denies urinary changes he states his urine is red because he drank beet juice. MSK - R hip injury in the past, denies new back pain Skin - denies new skin changes Neuro - denies new numbness or weakness Psych - denies anxiety or depression   Past Medical History:  Diagnosis Date  . Hypertension   . Stroke [facility]     History reviewed. No pertinent surgical history.   reports  that he has never smoked. He has never used smokeless tobacco. He reports current alcohol use. He reports that he does not use drugs.  No Known Allergies  Family History  Problem Relation Age of Onset  . Lupus Mother     Prior to Admission medications   Not on File    Physical Exam: Vitals:   04/29/20 0900 04/29/20 0915 04/29/20 0945 04/29/20 1000  BP: (!) 178/115 (!) 178/126 (!) 168/105 (!) 163/109  Pulse: 65 62 (!) 54 (!) 58  Resp: 11 16 15 20   Temp:      TempSrc:      SpO2: 95% 94% 92% 98%  Weight:      Height:         Constitutional: NAD, comfortable, pleasant, anxious at times Eyes: pupils are grossly equal ENMT: MMM, throat without exudates or erythema Neck: normal, supple, no masses, no thyromegaly noted Respiratory: CTAB, nwob, no cough noted Cardiovascular: rrr w/o mrg, warm extremities Abdomen: NBS, NT,   Musculoskeletal: moving all 4 extremities, strength grossly intact 5/5 in the UE and LE's Skin: no rashes, lesions, ulcers. No induration Neurologic: CN 2-12 grossly intact. Maybe some dysconjugate gaze when looking to the R, but he thinks double vision has improved.  Sensation intact Psychiatric: AO appearing, mentation appropriate  Labs on Admission: I have personally reviewed following labs and imaging studies  CBC: Recent Labs  Lab 04/29/20 0614  WBC 4.7  NEUTROABS 2.5  HGB 15.4  HCT 45.9  MCV 91.4  PLT 178   Basic Metabolic Panel: Recent Labs  Lab 04/29/20 0614  NA 137  K 3.4*  CL 101  CO2 27  GLUCOSE 113*  BUN 12  CREATININE 1.35*  CALCIUM 8.6*   GFR: Estimated Creatinine Clearance: 65.1 mL/min (A) (by C-G formula based on SCr of 1.35 mg/dL (H)). Liver Function Tests: No results for input(s): AST, ALT, ALKPHOS, BILITOT, PROT, ALBUMIN in the last 168 hours. No results for input(s): LIPASE, AMYLASE in the last 168 hours. No results for input(s): AMMONIA in the last 168 hours. Coagulation Profile: No results for input(s): INR, PROTIME in the last 168 hours. Cardiac Enzymes: No results for input(s): CKTOTAL, CKMB, CKMBINDEX, TROPONINI in the last 168 hours. BNP (last 3 results) No results for input(s): PROBNP in the last 8760 hours. HbA1C: No results for input(s): HGBA1C in the last 72 hours. CBG: No results for input(s): GLUCAP in the last 168 hours. Lipid Profile: No results for input(s): CHOL, HDL, LDLCALC, TRIG, CHOLHDL, LDLDIRECT in the last 72 hours. Thyroid Function Tests: No results for input(s): TSH, T4TOTAL, FREET4, T3FREE, THYROIDAB in the last 72  hours. Anemia Panel: No results for input(s): VITAMINB12, FOLATE, FERRITIN, TIBC, IRON, RETICCTPCT in the last 72 hours. Urine analysis: No results found for: COLORURINE, APPEARANCEUR, LABSPEC, PHURINE, GLUCOSEU, HGBUR, BILIRUBINUR, KETONESUR, PROTEINUR, UROBILINOGEN, NITRITE, LEUKOCYTESUR  Radiological Exams on Admission: CT Angio Head W or Wo Contrast  Result Date: 04/29/2020 CLINICAL DATA:  Diplopia since 3 p.m. yesterday. Personal history of CVA 2014. EXAM: CT ANGIOGRAPHY HEAD AND NECK TECHNIQUE: Multidetector CT imaging of the head and neck was performed using the standard protocol during bolus administration of intravenous contrast. Multiplanar CT image reconstructions and MIPs were obtained to evaluate the vascular anatomy. Carotid stenosis measurements (when applicable) are obtained utilizing NASCET criteria, using the distal internal carotid diameter as the denominator. CONTRAST:  80mL OMNIPAQUE IOHEXOL 350 MG/ML SOLN COMPARISON:  None. MR head without contrast 06/27/14 FINDINGS: Brain: Progressive white matter changes are seen  bilaterally, left greater than right. Lacunar infarct involving the left internal capsule appears remote. Remote infarct of the left putamen is again noted. Basal ganglia are otherwise intact. Insular ribbon is normal bilaterally. Thalami are within normal limits. Brainstem and cerebellum are normal. No acute infarct, hemorrhage, or mass lesion is present. The ventricles are of normal size. No significant extraaxial fluid collection is present. Vascular: No hyperdense vessel or unexpected calcification. Skull: Calvarium is intact. No focal lytic or blastic lesions are present. No significant extracranial soft tissue lesion is present. Sinuses: The paranasal sinuses and mastoid air cells are clear. Orbits: The globes and orbits are within normal limits. CTA NECK FINDINGS Aortic arch: Common origin of the left common carotid and innominate artery is noted, a normal variant. No  significant atherosclerotic disease or stenosis is present. Right carotid system: The right common carotid artery is within normal limits. Bifurcation is unremarkable. Cervical right ICA is normal. Left carotid system: The left common carotid artery is within normal limits. Bifurcation is unremarkable. Cervical left ICA is normal. Vertebral arteries: The left vertebral artery is the dominant vessel. Both vertebral arteries originate from the subclavian arteries without significant stenosis. No significant stenosis is present in either vertebral artery in the neck. Skeleton: Multilevel degenerative changes are noted. Foraminal narrowing is greatest on the right at C3-4 and on the left at C6-7. Vertebral body heights are maintained. No focal lytic or blastic lesions are present. Other neck: The soft tissues of the neck are within normal limits. No significant adenopathy is present. Salivary glands are within normal limits. Thyroid is unremarkable. Upper chest: The lung apices are clear. Review of the MIP images confirms the above findings CTA HEAD FINDINGS Anterior circulation: The internal carotid arteries are within normal limits from the high cervical segments through the ICA termini bilaterally. The A1 and M1 segments are normal. MCA bifurcations are within normal limits. The ACA and MCA branch vessels are normal. Posterior circulation: The left vertebral artery is dominant. Dominant AICA vessels are present bilaterally. The basilar artery is normal. A right scratched at the right posterior cerebral artery receives equal contribution from the right P1 segment and posterior communicating artery. Smaller left posterior communicating artery is present with a dominant left P1 segment. PCA branch vessels are within normal limits bilaterally. Venous sinuses: The dural sinuses are patent. The straight sinus and deep cerebral veins are intact. Cortical veins are within normal limits. Anatomic variants: Fetal type right  posterior cerebral artery. Review of the MIP images confirms the above findings IMPRESSION: 1. Normal variant CTA Circle of Willis without significant proximal stenosis, aneurysm, or branch vessel occlusion. 2. Normal CTA of the neck. 3. Progressive white matter disease bilaterally, left greater than right. This may be related to chronic microvascular ischemia. 4. Remote lacunar infarcts of the left internal capsule and left putamen. 5. Multilevel spondylosis of the cervical spine. Electronically Signed   By: Marin Roberts M.D.   On: 04/29/2020 10:06   CT Angio Neck W and/or Wo Contrast  Result Date: 04/29/2020 CLINICAL DATA:  Diplopia since 3 p.m. yesterday. Personal history of CVA 2014. EXAM: CT ANGIOGRAPHY HEAD AND NECK TECHNIQUE: Multidetector CT imaging of the head and neck was performed using the standard protocol during bolus administration of intravenous contrast. Multiplanar CT image reconstructions and MIPs were obtained to evaluate the vascular anatomy. Carotid stenosis measurements (when applicable) are obtained utilizing NASCET criteria, using the distal internal carotid diameter as the denominator. CONTRAST:  62mL OMNIPAQUE IOHEXOL 350 MG/ML  SOLN COMPARISON:  None. MR head without contrast 06/27/14 FINDINGS: Brain: Progressive white matter changes are seen bilaterally, left greater than right. Lacunar infarct involving the left internal capsule appears remote. Remote infarct of the left putamen is again noted. Basal ganglia are otherwise intact. Insular ribbon is normal bilaterally. Thalami are within normal limits. Brainstem and cerebellum are normal. No acute infarct, hemorrhage, or mass lesion is present. The ventricles are of normal size. No significant extraaxial fluid collection is present. Vascular: No hyperdense vessel or unexpected calcification. Skull: Calvarium is intact. No focal lytic or blastic lesions are present. No significant extracranial soft tissue lesion is present.  Sinuses: The paranasal sinuses and mastoid air cells are clear. Orbits: The globes and orbits are within normal limits. CTA NECK FINDINGS Aortic arch: Common origin of the left common carotid and innominate artery is noted, a normal variant. No significant atherosclerotic disease or stenosis is present. Right carotid system: The right common carotid artery is within normal limits. Bifurcation is unremarkable. Cervical right ICA is normal. Left carotid system: The left common carotid artery is within normal limits. Bifurcation is unremarkable. Cervical left ICA is normal. Vertebral arteries: The left vertebral artery is the dominant vessel. Both vertebral arteries originate from the subclavian arteries without significant stenosis. No significant stenosis is present in either vertebral artery in the neck. Skeleton: Multilevel degenerative changes are noted. Foraminal narrowing is greatest on the right at C3-4 and on the left at C6-7. Vertebral body heights are maintained. No focal lytic or blastic lesions are present. Other neck: The soft tissues of the neck are within normal limits. No significant adenopathy is present. Salivary glands are within normal limits. Thyroid is unremarkable. Upper chest: The lung apices are clear. Review of the MIP images confirms the above findings CTA HEAD FINDINGS Anterior circulation: The internal carotid arteries are within normal limits from the high cervical segments through the ICA termini bilaterally. The A1 and M1 segments are normal. MCA bifurcations are within normal limits. The ACA and MCA branch vessels are normal. Posterior circulation: The left vertebral artery is dominant. Dominant AICA vessels are present bilaterally. The basilar artery is normal. A right scratched at the right posterior cerebral artery receives equal contribution from the right P1 segment and posterior communicating artery. Smaller left posterior communicating artery is present with a dominant left P1  segment. PCA branch vessels are within normal limits bilaterally. Venous sinuses: The dural sinuses are patent. The straight sinus and deep cerebral veins are intact. Cortical veins are within normal limits. Anatomic variants: Fetal type right posterior cerebral artery. Review of the MIP images confirms the above findings IMPRESSION: 1. Normal variant CTA Circle of Willis without significant proximal stenosis, aneurysm, or branch vessel occlusion. 2. Normal CTA of the neck. 3. Progressive white matter disease bilaterally, left greater than right. This may be related to chronic microvascular ischemia. 4. Remote lacunar infarcts of the left internal capsule and left putamen. 5. Multilevel spondylosis of the cervical spine. Electronically Signed   By: San Morelle M.D.   On: 04/29/2020 10:06    EKG: Independently reviewed. NSR with TWI's in some lateral leads.  He is without chest pain currently.  Assessment/Plan Active Problems:   Stroke [facility])  Jordan Logan is a 64 y.o. 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 -- talking with Neurology we will base treatment off of MRI.  If stroke on MRI we will have blood pressure goal of <220 systolic and if no stroke we will treat as severe HTN with blood pressure goal of <180 systolic.   I contacted Neurology team --consider starting amlodipine -With hypokalemia and severe high blood pressure, consider hyperaldosteronism --?stepdown unit for IV antihypertensives --startt ASA 325mg , atorvastatin 80mg , and TTE and outpatient follow up with neurology --TTE --EKG for chest  pain --telemetry --UDS --f/u TSH, lipid panel and hgba1c --MRI wwo contrast per consult. --please set up a PCP and outpatient neurology  HTN, worsened some by anxiety Possibly secondary: Hypokalemia, so possibly hyperaldosteronism Urine red from "beat juice", follow up UA and kidney numbers.  TWI's on EKG. With elevated blood pressures, there seem to be new TWI's, but no chest pain and can monitor as blood pressure improves.  Monitor for chest pain.  Doubt HTN   AKI or CKD?-Continue to monitor renal function, red urine from "beet juice" but will get a UA, his mother had lupus, ?inflammatory Hypokalemia to 3.4, check magnesium Hypocalcemia  Patient and/or Family completely agreed with the plan, expressed understanding and I answered all questions.  DVT prophylaxis: Lovenox SQ Code Status: Full code Family Communication: n/a Disposition Plan: likely home when able Consults called: Neurology Dr. , PT/OT Admission status: inpatient because of a presumed acute stroke requiring MRI in regards to treatment and hypertension plan that might require IV medications.   A total of 90 minutes utilized during this admission.  DO [facility]   If 7PM-7AM, please contact night-coverage [email or web address] Password [facility]  04/29/2020, 10:37 AM

## 2020-04-29 NOTE — Progress Notes (Signed)
Followed up on MRI brain result overnight per Dr. Thornell Mule.  No new acute infarct on MRI but showed greatly progressive chronic small vessel ischemic disease. Normal CTA of the neck.   Symptoms likely more related to hypertensive urgency from chronic uncontrolled HTN.   Will add on IV labetalol 5mg  PRN for BP greater than 180 and/or diastolic greater than 110

## 2020-04-29 NOTE — ED Notes (Signed)
CT called and notified of stroke pt.

## 2020-04-29 NOTE — ED Notes (Signed)
Neurologist consulted patient and requested CT and MRI and patient states he can not afford it.

## 2020-04-29 NOTE — ED Notes (Signed)
Provider talked with pt, he is agreeing to stay at this time.

## 2020-04-29 NOTE — Consult Note (Signed)
  [facility] TeleNeurology Consult Services  Stat Consult  Date of Service:   04/29/2020 05:28:35  Impression:     .  H53.2 - Diplopia  Comments/Sign-Out: 64 year old male with past medical history of HTN presents with symptoms of double vision, dizziness and imbalance that started yesterday at 3 PM and became gradually worse. DDX suspecting brainstem ischemia vs ischemia to 3rd CN nerve. Rule out CTA head and neck to rule out PCOM aneurysm. If CTH is normal start aspirin. Recommend admission for MRI brain.  Metrics: [facility] Notification Time: 04/29/2020 05:26:37 Stamp Time: 04/29/2020 05:28:35 Callback Response Time: 04/29/2020 05:28:46  Our recommendations are outlined below.  Recommendations:     .  Antiplatelet Therapy     .  Initiate Aspirin 325 MG Daily  Imaging Studies:     .  MRI Head with and Without Contrast     .  Echocardiogram - Transthoracic Echocardiogram  Disposition: Neurology Follow Up Recommended  Sign Out:     .  Discussed with Emergency Department Provider  ----------------------------------------------------------------------------------------------------  Chief Complaint: Double vision  History of Present Illness: Patient is a 64 year old Male.  64 year old male with past medical history of HTN, hemorrhagic even in 2013 presents with symptoms of double vision, dizziness and imbalance that started yesterday at 3 PM and became gradually worse. 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 Patient is not able to adduct the left eye. Patient is refusing a CTH. Patient was informed that we are unable to treat him without brain imaging studies. Patient was informed of risks including disabling deficits and/or death.   Past Medical History:     . Hypertension     . Stroke     . There is NO history of Diabetes Mellitus     . There is NO history of Hyperlipidemia     . There is NO history of Atrial Fibrillation     . There is NO history of  Coronary Artery Disease      Examination: BP(206/130), Pulse(68), Blood Glucose(93) 1A: Level of Consciousness - Alert; keenly responsive + 0 1B: Ask Month and Age - Both Questions Right + 0 1C: Blink Eyes & Squeeze Hands - Performs Both Tasks + 0 2: Test Horizontal Extraocular Movements - Normal + 0 3: Test Visual Fields - No Visual Loss + 0 4: Test Facial Palsy (Use Grimace if Obtunded) - Normal symmetry + 0 5A: Test Left Arm Motor Drift - No Drift for 10 Seconds + 0 5B: Test Right Arm Motor Drift - No Drift for 10 Seconds + 0 6A: Test Left Leg Motor Drift - No Drift for 5 Seconds + 0 6B: Test Right Leg Motor Drift - No Drift for 5 Seconds + 0 7: Test Limb Ataxia (FNF/Heel-Shin) - No Ataxia + 0 8: Test Sensation - Normal; No sensory loss + 0 9: Test Language/Aphasia - Normal; No aphasia + 0 10: Test Dysarthria - Normal + 0 11: Test Extinction/Inattention - No abnormality + 0  NIHSS Score: 0    Patient is being evaluated for possible acute neurologic impairment and high probability of imminent or life-threatening deterioration. I spent total of 30 minutes providing care to this patient, including time for face to face visit via telemedicine, review of medical records, imaging studies and discussion of findings with providers, the patient and/or family.   Dr Lucious Groves   [facility] [phone]  Case [id number]

## 2020-04-29 NOTE — ED Triage Notes (Signed)
Patient arrives via EMS with complaints of hypertension and blurry vision. Patient states he felt dizzy last night and went to bed early. Patient states he woke up around 0230 and had double vision which he's never had before. Patient negative for stroke symptoms, no other complaints other than blurry vision. Patient has also been under a lot of stress over the past year. Patient does not have a PCP since moving back here in May, patient also has not been on BP medications.

## 2020-04-29 NOTE — ED Notes (Signed)
[facility] called for transport and report called to [facility] RN 5W at [facility]

## 2020-04-29 NOTE — ED Notes (Signed)
Dr. Thornell Mule aware of pt's positive covid test.

## 2020-04-29 NOTE — ED Notes (Signed)
Pt stating that he would like to leave. I asked pt why he was wanting to leave at this time, pt responded that he does not want to wait here, he has not been moved to [facility], and his wife is sick at home and he needs to go care for her. I explained to the pt that we were still waiting for a bed to open at [facility] for him to be moved over. I also explained the risk of leaving AMA could result in him becoming disabled, preventing him from caring for his wife. Pt was still addiment that he would like to leave. I asked if there was anything we could do to convince the pt to stay. Pt said no. Pt stated he would like his prescriptions and to leave. Provider notified.

## 2020-04-29 NOTE — ED Notes (Signed)
Patient is refusing MRI and CT.

## 2020-04-29 NOTE — ED Notes (Signed)
Standley Dakins, MRI tech and he's coming in to do MRI for this pt.

## 2020-04-29 NOTE — ED Provider Notes (Signed)
[facility]-EMERGENCY DEPT Provider Note   CSN: [id number] Arrival date & time: 04/29/20  6301   Time seen 4:50 AM  History Chief Complaint  Patient presents with  . Hypertension  . Diplopia    Jordan Logan is a 64 y.o. male.  HPI   Patient states he was on blood pressure medications in 2015 for short time.  He states he tried diet and exercise and he did not take the blood pressure pills anymore.  He is a little vague as to if the doctor told him to stop taking them or if he just stopped taking them on his own.  He states when his weight is up his blood pressure is like 160/90.  When he eats his weight is down he states it can be 130-155/80.  He states he does not check his blood pressure.  He states that he hurt himself doing martial arts and he has been unable to exercise plus his wife's been sick.  He states yesterday afternoon, June 19 he started having double vision.  He denies headache, nausea, vomiting, or numbness or tingling of his extremities.  PCP Patient, No Pcp Per   Past Medical History:  Diagnosis Date  . Hypertension   . Stroke [facility])     Patient Active Problem List   Diagnosis Date Noted  . CVA (cerebrovascular accident due to intracerebral hemorrhage) (HCC) 01/04/2014  . Hypertension 06/20/2013    History reviewed. No pertinent surgical history.     Family History  Problem Relation Age of Onset  . Lupus Mother     Social History   Tobacco Use  . Smoking status: Never Smoker  . Smokeless tobacco: Never Used  Substance Use Topics  . Alcohol use: Yes  . Drug use: No  lives with spouse  Home Medications Prior to Admission medications   Not on File    Allergies    Patient has no known allergies.  Review of Systems   Review of Systems  All other systems reviewed and are negative.   Physical Exam Updated Vital Signs BP (!) 169/117   Pulse (!) 58   Temp 97.8 F (36.6 C) (Oral)   Resp 19   Ht 5' 10.5" (1.791  m)   Wt 99.8 kg   SpO2 95%   BMI 31.12 kg/m   Physical Exam Vitals and nursing note reviewed.  Constitutional:      Appearance: Normal appearance. He is normal weight.  HENT:     Head: Normocephalic and atraumatic.     Right Ear: External ear normal.     Left Ear: External ear normal.     Nose: Nose normal.     Mouth/Throat:     Mouth: Mucous membranes are moist.     Pharynx: No oropharyngeal exudate or posterior oropharyngeal erythema.  Eyes:     Conjunctiva/sclera: Conjunctivae normal.     Pupils: Pupils are equal, round, and reactive to light.     Comments: When I do patient's ocular range of motion I notice a subtle problem with his left eye.  When he looks to the right his left eye it looks to the right and then slowly drifts a small amount laterally.  When I have him look to his left the left eye does not move for a brief period of time and then moves in unison with the right eye.  He does note that his double vision is worse when he looks to the right.  I  do not see any difference when he looks superiorly or inferiorly or to the left.  Cardiovascular:     Rate and Rhythm: Normal rate and regular rhythm.     Pulses: Normal pulses.     Heart sounds: No murmur heard.   Pulmonary:     Effort: Pulmonary effort is normal. No respiratory distress.     Breath sounds: Normal breath sounds.  Musculoskeletal:        General: Normal range of motion.  Skin:    General: Skin is warm and dry.  Neurological:     General: No focal deficit present.     Mental Status: He is alert and oriented to person, place, and time.     Cranial Nerves: No cranial nerve deficit.  Psychiatric:        Mood and Affect: Mood normal.        Behavior: Behavior normal.        Thought Content: Thought content normal.     ED Results / Procedures / Treatments   Labs (all labs ordered are listed, but only abnormal results are displayed) Results for orders placed or performed during the hospital encounter  of 04/29/20  CBC with Differential  Result Value Ref Range   WBC 4.7 4.0 - 10.5 K/uL   RBC 5.02 4.22 - 5.81 MIL/uL   Hemoglobin 15.4 13.0 - 17.0 g/dL   HCT 45.9 39 - 52 %   MCV 91.4 80.0 - 100.0 fL   MCH 30.7 26.0 - 34.0 pg   MCHC 33.6 30.0 - 36.0 g/dL   RDW 15.5 11.5 - 15.5 %   Platelets 178 150 - 400 K/uL   nRBC 0.0 0.0 - 0.2 %   Neutrophils Relative % 53 %   Neutro Abs 2.5 1.7 - 7.7 K/uL   Lymphocytes Relative 36 %   Lymphs Abs 1.7 0.7 - 4.0 K/uL   Monocytes Relative 8 %   Monocytes Absolute 0.4 0 - 1 K/uL   Eosinophils Relative 2 %   Eosinophils Absolute 0.1 0 - 0 K/uL   Basophils Relative 1 %   Basophils Absolute 0.0 0 - 0 K/uL   Immature Granulocytes 0 %   Abs Immature Granulocytes 0.01 0.00 - 0.07 K/uL  Basic metabolic panel  Result Value Ref Range   Sodium 137 135 - 145 mmol/L   Potassium 3.4 (L) 3.5 - 5.1 mmol/L   Chloride 101 98 - 111 mmol/L   CO2 27 22 - 32 mmol/L   Glucose, Bld 113 (H) 70 - 99 mg/dL   BUN 12 8 - 23 mg/dL   Creatinine, Ser 1.35 (H) 0.61 - 1.24 mg/dL   Calcium 8.6 (L) 8.9 - 10.3 mg/dL   GFR calc non Af Amer 55 (L) >60 mL/min   GFR calc Af Amer >60 >60 mL/min   Anion gap 9 5 - 15   Laboratory interpretation all normal except mild hypokalemia, renal insufficienty   EKG EKG Interpretation  Date/Time:  Sunday April 29 2020 03:38:52 EDT Ventricular Rate:  74 PR Interval:    QRS Duration: 97 QT Interval:  414 QTC Calculation: 460 R Axis:   -43 Text Interpretation: Sinus rhythm Left anterior fascicular block Left ventricular hypertrophy No significant change since last tracing 27 Jun 2014 Confirmed by Rolland Porter [phone]) on 04/29/2020 3:54:23 AM   Radiology No results found.  Procedures Procedures (including critical care time)  Medications Ordered in ED Medications  labetalol (NORMODYNE) injection 10 mg (10 mg Intravenous Given 04/29/20  Berit.Ludwig)    ED Course  I have reviewed the triage vital signs and the nursing notes.  Pertinent  labs & imaging results that were available during my care of the patient were reviewed by me and considered in my medical decision making (see chart for details).    MDM Rules/Calculators/A&P                          Patient appears to have a early ocular palsy on of the left eye.  We discussed doing a CT however he refuses.  He states he does not want one due to the cost and because of the radiation.  I then reviewed his chart and saw his had a hemorrhagic stroke before.  5:32 AM I spoke to the teleneurology screening nurse and he is going to have an immediate teleneurology consult done.  The code stroke cart was placed in his room.  5:58 AM teleneurology has evaluated patient.  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  He strongly feels patient needs to get CT with and without contrast of head and neck and admission for stroke work-up.  Patient however refused to have that done when he talked to the patient.  6:10 AM I went back and talked to the patient, his nurses in the room.  He is very adamant he has not going to have any other testing done he just wants medication to get his blood pressure down.  He states he cannot afford the CT scan.  However when I ask him about his insurance because he said both he and his wife are on Social Security he states he has Medicare however he is not convinced they will pay for these tests.  I have checked his blood pressure during my interview and it was 185/125.  He was given labetalol IV.  Patient evidently has [employer], social work consult has been called to help patient decide hopefully his insurance will pay for his test and he will stay and get the medical care that he needs.  7:00 AM patient's blood pressure is 169/117.  He is awaiting social work consult.  7:45 AM Dr Fredderick Phenix made aware of situation, hopefully the social worker will be able to convince patient to go ahead and get the test he needs done, if not he will be  discharged home with blood pressure medication understanding that he is at risk for having significant neurological deficit or even death.  Final Clinical Impression(s) / ED Diagnoses Final diagnoses:  Essential hypertension  Left oculomotor nerve palsy  Renal insufficiency    Rx / DC Orders  Disposition pending  Devoria Albe, MD, Concha Pyo, MD 04/29/20 [phone]

## 2020-04-30 ENCOUNTER — Inpatient Hospital Stay (HOSPITAL_COMMUNITY): Payer: Medicare (Managed Care)

## 2020-04-30 DIAGNOSIS — I6389 Other cerebral infarction: Secondary | ICD-10-CM

## 2020-04-30 DIAGNOSIS — I10 Essential (primary) hypertension: Secondary | ICD-10-CM

## 2020-04-30 DIAGNOSIS — N289 Disorder of kidney and ureter, unspecified: Secondary | ICD-10-CM

## 2020-04-30 DIAGNOSIS — I169 Hypertensive crisis, unspecified: Principal | ICD-10-CM

## 2020-04-30 LAB — ECHOCARDIOGRAM COMPLETE
Height: 70 in
Weight: 3689.62 oz

## 2020-04-30 LAB — URINALYSIS, ROUTINE W REFLEX MICROSCOPIC
Bilirubin Urine: NEGATIVE
Glucose, UA: NEGATIVE mg/dL
Hgb urine dipstick: NEGATIVE
Ketones, ur: NEGATIVE mg/dL
Leukocytes,Ua: NEGATIVE
Nitrite: NEGATIVE
Protein, ur: NEGATIVE mg/dL
Specific Gravity, Urine: 1.032 — ABNORMAL HIGH (ref 1.005–1.030)
pH: 5 (ref 5.0–8.0)

## 2020-04-30 LAB — RAPID URINE DRUG SCREEN, HOSP PERFORMED
Amphetamines: NOT DETECTED
Barbiturates: NOT DETECTED
Benzodiazepines: NOT DETECTED
Cocaine: NOT DETECTED
Opiates: NOT DETECTED
Tetrahydrocannabinol: POSITIVE — AB

## 2020-04-30 LAB — HIV ANTIBODY (ROUTINE TESTING W REFLEX): HIV Screen 4th Generation wRfx: NONREACTIVE

## 2020-04-30 LAB — TSH: TSH: 0.849 u[IU]/mL (ref 0.350–4.500)

## 2020-04-30 MED ORDER — AMLODIPINE BESYLATE 10 MG PO TABS
10.0000 mg | ORAL_TABLET | Freq: Every day | ORAL | Status: DC
  Administered 2020-04-30 – 2020-05-01 (×2): 10 mg via ORAL
  Filled 2020-04-30 (×3): qty 1

## 2020-04-30 MED ORDER — HYDRALAZINE HCL 50 MG PO TABS: 50.0000 mg | ORAL_TABLET | Freq: Three times a day (TID) | ORAL | Status: DC

## 2020-04-30 MED ORDER — HYDRALAZINE HCL 20 MG/ML IJ SOLN
10.0000 mg | Freq: Four times a day (QID) | INTRAMUSCULAR | Status: DC | PRN
  Administered 2020-04-30: 10 mg via INTRAVENOUS
  Filled 2020-04-30: qty 1

## 2020-04-30 MED ORDER — HYDRALAZINE HCL 50 MG PO TABS
50.0000 mg | ORAL_TABLET | Freq: Three times a day (TID) | ORAL | Status: DC
  Administered 2020-04-30 (×2): 50 mg via ORAL
  Filled 2020-04-30 (×3): qty 1

## 2020-04-30 NOTE — Plan of Care (Signed)
Plan of care initiated.

## 2020-04-30 NOTE — Progress Notes (Addendum)
PROGRESS NOTE                                                                                                                                                                                                             Patient Demographics:    Jordan Logan, is a 64 y.o. male, DOB - 1955-11-13, JGO:[id number]  Admit date - 04/29/2020   Admitting Physician Sueanne Margarita, DO  Outpatient Primary MD for the patient is Patient, No Pcp Per  LOS - 1  Chief Complaint  Patient presents with  . Hypertension  . Diplopia       Brief Narrative -  PEACE JOST is a 64 y.o. 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   Subjective:    Therisa Doyne today has, No headache, No chest pain, No abdominal pain - No Nausea, No new weakness tingling or numbness, No Cough - SOB. No problems with his vision.   Assessment  & Plan :     1.  Hypertensive crisis with hypertensive encephalopathy AKI.  Due to poorly controlled blood pressure, stopped taking his blood pressure medications 5 years ago, thankfully MRI is nonacute, will gradually reintroduce blood pressure medications goal of normalizing over the next 2 to 3 weeks.  Deficits have improved, echo pending, monitor renal function, advance activity, case discussed with stroke MD Dr. Leonie Man.  2.  AKI versus CKD 3.  Due to #1 above.  Last renal function 5 years ago, this could very well become his new baseline.  Check renal ultrasound and UA.  3.  History of stroke in the past.  Continue aspirin and statin.  No results found for: HGBA1C Lipid Panel  No results found for: CHOL, TRIG, HDL, CHOLHDL, VLDL, LDLCALC, LDLDIRECT, LABVLDL  No results found for: TSH     Condition - Extremely  Guarded  Family Communication  :  Margart Sickles [phone] - 04/30/20  Code Status :  Full  Consults  :  Neuro  Procedures  :    MRI - 1. No acute intracranial abnormality. 2. Greatly progressive chronic small vessel ischemic disease since 2015 with multiple chronic lacunar infarcts. 3. Increased number of chronic cerebral microhemorrhages which could be related to chronic hypertension, vasculitis, or cerebral amyloid angiopathy.  PUD Prophylaxis :  Disposition Plan  :    Status is: Inpatient  Remains inpatient appropriate because:Ongoing diagnostic testing needed not appropriate for outpatient work up   Dispo: The patient is from: Home              Anticipated d/c is to: Home              Anticipated d/c date is: 2 days              Patient currently is not medically stable to d/c.   DVT Prophylaxis  :  Lovenox    Lab Results  Component Value Date   PLT 178 04/29/2020    Diet :  Diet Order            DIET SOFT Room service appropriate? Yes; Fluid consistency: Thin  Diet effective now                  Inpatient Medications Scheduled Meds: .  stroke: mapping our early stages of recovery book   Does not apply Once  . amLODipine  10 mg Oral Daily  . aspirin  300 mg Rectal Daily   Or  . aspirin  325 mg Oral Daily  . atorvastatin  80 mg Oral Daily  . enoxaparin (LOVENOX) injection  40 mg Subcutaneous QHS  . hydrALAZINE  50 mg Oral Q8H   Continuous Infusions: PRN Meds:.acetaminophen **OR** [DISCONTINUED] acetaminophen (TYLENOL) oral liquid 160 mg/5 mL **OR** [DISCONTINUED] acetaminophen, hydrALAZINE  Antibiotics  :   Anti-infectives (From admission, onward)   None        Objective:   Vitals:   04/30/20 0400 04/30/20 0554 04/30/20 0744 04/30/20 0946  BP: (!) 126/93 (!) 136/101 (!) 150/99 (!) 152/92  Pulse: 60 69 62 65  Resp: '17 18 14 15  ' Temp: 98.5 F (36.9 C) 97.7 F (36.5 C) 98.2 F (36.8 C) 98.5 F (36.9 C)  TempSrc: Axillary Axillary Oral Oral   SpO2: 94% 96% 96% 97%  Weight:      Height:        SpO2: 97 %  Wt Readings from Last 3 Encounters:  04/29/20 104.6 kg  11/06/14 102.5 kg  07/06/14 103.4 kg    No intake or output data in the 24 hours ending 04/30/20 1029   Physical Exam  Awake Alert, No new F.N deficits, Normal affect Accident.AT,PERRAL Supple Neck,No JVD, No cervical lymphadenopathy appriciated.  Symmetrical Chest wall movement, Good air movement bilaterally, CTAB RRR,No Gallops,Rubs or new Murmurs, No Parasternal Heave +ve B.Sounds, Abd Soft, No tenderness, No organomegaly appriciated, No rebound - guarding or rigidity. No Cyanosis, Clubbing or edema, No new Rash or bruise      Data Review:    Recent Labs  Lab 04/29/20 0614  WBC 4.7  HGB 15.4  HCT 45.9  PLT 178  MCV 91.4  MCH 30.7  MCHC 33.6  RDW 15.5  LYMPHSABS 1.7  MONOABS 0.4  EOSABS 0.1  BASOSABS 0.0    Recent Labs  Lab 04/29/20 0614  NA 137  K 3.4*  CL 101  CO2 27  GLUCOSE 113*  BUN 12  CREATININE 1.35*  CALCIUM 8.6*  CRP <0.5    Recent Labs  Lab 04/29/20 0614 04/29/20 1022  CRP <0.5  --   SARSCOV2NAA  --  POSITIVE*    ------------------------------------------------------------------------------------------------------------------ No results for input(s): CHOL, HDL, LDLCALC, TRIG, CHOLHDL, LDLDIRECT in the last 72 hours.  No results found for: HGBA1C ------------------------------------------------------------------------------------------------------------------ No results for input(s): TSH, T4TOTAL, T3FREE,  THYROIDAB in the last 72 hours.  Invalid input(s): FREET3 ------------------------------------------------------------------------------------------------------------------ No results for input(s): VITAMINB12, FOLATE, FERRITIN, TIBC, IRON, RETICCTPCT in the last 72 hours.  Coagulation profile No results for input(s): INR, PROTIME in the last 168 hours.  No results for input(s): DDIMER in the last 72  hours.  Cardiac Enzymes No results for input(s): CKMB, TROPONINI, MYOGLOBIN in the last 168 hours.  Invalid input(s): CK ------------------------------------------------------------------------------------------------------------------ No results found for: BNP  Micro Results Recent Results (from the past 240 hour(s))  SARS Coronavirus 2 by RT PCR (hospital order, performed in [facility] hospital lab) Nasopharyngeal Nasopharyngeal Swab     Status: Abnormal   Collection Time: 04/29/20 10:22 AM   Specimen: Nasopharyngeal Swab  Result Value Ref Range Status   SARS Coronavirus 2 POSITIVE (A) NEGATIVE Final    Comment: RESULT CALLED TO, READ BACK BY AND VERIFIED WITH: TAI,M RN '@1250'  ON 04/29/20 JACKSON,K (NOTE) SARS-CoV-2 target nucleic acids are DETECTED  SARS-CoV-2 RNA is generally detectable in upper respiratory specimens  during the acute phase of infection.  Positive results are indicative  of the presence of the identified virus, but do not rule out bacterial infection or co-infection with other pathogens not detected by the test.  Clinical correlation with patient history and  other diagnostic information is necessary to determine patient infection status.  The expected result is negative.  Fact Sheet for Patients:   StrictlyIdeas.no   Fact Sheet for Healthcare Providers:   BankingDealers.co.za    This test is not yet approved or cleared by the [place] FDA and  has been authorized for detection and/or diagnosis of SARS-CoV-2 by FDA under an Emergency Use Authorization (EUA).  This EUA will remain in effect (meaning this t est can be used) for the duration of  the COVID-19 declaration under Section 564(b)(1) of the Act, 21 U.S.C. section 360-bbb-3(b)(1), unless the authorization is terminated or revoked sooner.  Performed at [facility] [place] [address], [place], [place] [address]     Radiology  Reports CT Angio Head W or Wo Contrast  Result Date: 04/29/2020 CLINICAL DATA:  Diplopia since 3 p.m. yesterday. Personal history of CVA 2014. EXAM: CT ANGIOGRAPHY HEAD AND NECK TECHNIQUE: Multidetector CT imaging of the head and neck was performed using the standard protocol during bolus administration of intravenous contrast. Multiplanar CT image reconstructions and MIPs were obtained to evaluate the vascular anatomy. Carotid stenosis measurements (when applicable) are obtained utilizing NASCET criteria, using the distal internal carotid diameter as the denominator. CONTRAST:  60m OMNIPAQUE IOHEXOL 350 MG/ML SOLN COMPARISON:  None. MR head without contrast 06/27/14 FINDINGS: Brain: Progressive white matter changes are seen bilaterally, left greater than right. Lacunar infarct involving the left internal capsule appears remote. Remote infarct of the left putamen is again noted. Basal ganglia are otherwise intact. Insular ribbon is normal bilaterally. Thalami are within normal limits. Brainstem and cerebellum are normal. No acute infarct, hemorrhage, or mass lesion is present. The ventricles are of normal size. No significant extraaxial fluid collection is present. Vascular: No hyperdense vessel or unexpected calcification. Skull: Calvarium is intact. No focal lytic or blastic lesions are present. No significant extracranial soft tissue lesion is present. Sinuses: The paranasal sinuses and mastoid air cells are clear. Orbits: The globes and orbits are within normal limits. CTA NECK FINDINGS Aortic arch: Common origin of the left common carotid and innominate artery is noted, a normal variant. No significant atherosclerotic disease or stenosis is present. Right carotid system: The right  common carotid artery is within normal limits. Bifurcation is unremarkable. Cervical right ICA is normal. Left carotid system: The left common carotid artery is within normal limits. Bifurcation is unremarkable. Cervical left ICA  is normal. Vertebral arteries: The left vertebral artery is the dominant vessel. Both vertebral arteries originate from the subclavian arteries without significant stenosis. No significant stenosis is present in either vertebral artery in the neck. Skeleton: Multilevel degenerative changes are noted. Foraminal narrowing is greatest on the right at C3-4 and on the left at C6-7. Vertebral body heights are maintained. No focal lytic or blastic lesions are present. Other neck: The soft tissues of the neck are within normal limits. No significant adenopathy is present. Salivary glands are within normal limits. Thyroid is unremarkable. Upper chest: The lung apices are clear. Review of the MIP images confirms the above findings CTA HEAD FINDINGS Anterior circulation: The internal carotid arteries are within normal limits from the high cervical segments through the ICA termini bilaterally. The A1 and M1 segments are normal. MCA bifurcations are within normal limits. The ACA and MCA branch vessels are normal. Posterior circulation: The left vertebral artery is dominant. Dominant AICA vessels are present bilaterally. The basilar artery is normal. A right scratched at the right posterior cerebral artery receives equal contribution from the right P1 segment and posterior communicating artery. Smaller left posterior communicating artery is present with a dominant left P1 segment. PCA branch vessels are within normal limits bilaterally. Venous sinuses: The dural sinuses are patent. The straight sinus and deep cerebral veins are intact. Cortical veins are within normal limits. Anatomic variants: Fetal type right posterior cerebral artery. Review of the MIP images confirms the above findings IMPRESSION: 1. Normal variant CTA Circle of Willis without significant proximal stenosis, aneurysm, or branch vessel occlusion. 2. Normal CTA of the neck. 3. Progressive white matter disease bilaterally, left greater than right. This may be  related to chronic microvascular ischemia. 4. Remote lacunar infarcts of the left internal capsule and left putamen. 5. Multilevel spondylosis of the cervical spine. Electronically Signed   By: San Morelle M.D.   On: 04/29/2020 10:06   CT Angio Neck W and/or Wo Contrast  Result Date: 04/29/2020 CLINICAL DATA:  Diplopia since 3 p.m. yesterday. Personal history of CVA 2014. EXAM: CT ANGIOGRAPHY HEAD AND NECK TECHNIQUE: Multidetector CT imaging of the head and neck was performed using the standard protocol during bolus administration of intravenous contrast. Multiplanar CT image reconstructions and MIPs were obtained to evaluate the vascular anatomy. Carotid stenosis measurements (when applicable) are obtained utilizing NASCET criteria, using the distal internal carotid diameter as the denominator. CONTRAST:  31m OMNIPAQUE IOHEXOL 350 MG/ML SOLN COMPARISON:  None. MR head without contrast 06/27/14 FINDINGS: Brain: Progressive white matter changes are seen bilaterally, left greater than right. Lacunar infarct involving the left internal capsule appears remote. Remote infarct of the left putamen is again noted. Basal ganglia are otherwise intact. Insular ribbon is normal bilaterally. Thalami are within normal limits. Brainstem and cerebellum are normal. No acute infarct, hemorrhage, or mass lesion is present. The ventricles are of normal size. No significant extraaxial fluid collection is present. Vascular: No hyperdense vessel or unexpected calcification. Skull: Calvarium is intact. No focal lytic or blastic lesions are present. No significant extracranial soft tissue lesion is present. Sinuses: The paranasal sinuses and mastoid air cells are clear. Orbits: The globes and orbits are within normal limits. CTA NECK FINDINGS Aortic arch: Common origin of the left common carotid and innominate artery is  noted, a normal variant. No significant atherosclerotic disease or stenosis is present. Right carotid system:  The right common carotid artery is within normal limits. Bifurcation is unremarkable. Cervical right ICA is normal. Left carotid system: The left common carotid artery is within normal limits. Bifurcation is unremarkable. Cervical left ICA is normal. Vertebral arteries: The left vertebral artery is the dominant vessel. Both vertebral arteries originate from the subclavian arteries without significant stenosis. No significant stenosis is present in either vertebral artery in the neck. Skeleton: Multilevel degenerative changes are noted. Foraminal narrowing is greatest on the right at C3-4 and on the left at C6-7. Vertebral body heights are maintained. No focal lytic or blastic lesions are present. Other neck: The soft tissues of the neck are within normal limits. No significant adenopathy is present. Salivary glands are within normal limits. Thyroid is unremarkable. Upper chest: The lung apices are clear. Review of the MIP images confirms the above findings CTA HEAD FINDINGS Anterior circulation: The internal carotid arteries are within normal limits from the high cervical segments through the ICA termini bilaterally. The A1 and M1 segments are normal. MCA bifurcations are within normal limits. The ACA and MCA branch vessels are normal. Posterior circulation: The left vertebral artery is dominant. Dominant AICA vessels are present bilaterally. The basilar artery is normal. A right scratched at the right posterior cerebral artery receives equal contribution from the right P1 segment and posterior communicating artery. Smaller left posterior communicating artery is present with a dominant left P1 segment. PCA branch vessels are within normal limits bilaterally. Venous sinuses: The dural sinuses are patent. The straight sinus and deep cerebral veins are intact. Cortical veins are within normal limits. Anatomic variants: Fetal type right posterior cerebral artery. Review of the MIP images confirms the above findings  IMPRESSION: 1. Normal variant CTA Circle of Willis without significant proximal stenosis, aneurysm, or branch vessel occlusion. 2. Normal CTA of the neck. 3. Progressive white matter disease bilaterally, left greater than right. This may be related to chronic microvascular ischemia. 4. Remote lacunar infarcts of the left internal capsule and left putamen. 5. Multilevel spondylosis of the cervical spine. Electronically Signed   By: San Morelle M.D.   On: 04/29/2020 10:06   MR BRAIN W WO CONTRAST  Result Date: 04/29/2020 CLINICAL DATA:  Diplopia.  COVID-19 positive. EXAM: MRI HEAD WITHOUT AND WITH CONTRAST TECHNIQUE: Multiplanar, multiecho pulse sequences of the brain and surrounding structures were obtained without and with intravenous contrast. CONTRAST:  72m GADAVIST GADOBUTROL 1 MMOL/ML IV SOLN COMPARISON:  Head and neck CTA 04/29/2020 and head MRI 06/27/2014 FINDINGS: Brain: No acute infarct, mass, midline shift, or extra-axial fluid collection is identified. Patchy T2 hyperintensities in the cerebral white matter bilaterally have greatly progressed from the prior MRI and are nonspecific but compatible with moderate chronic small vessel ischemic disease. There is unchanged encephalomalacia in the posterior left basal ganglia related to a remote hemorrhage. Chronic lacunar infarcts are present more anteriorly in the left basal ganglia and corona radiata and are new from the prior MRI. A chronic left paracentral pontine infarct is also new. There are multiple foci of chronic microhemorrhage peripherally in both cerebral hemispheres which have increased in number from the prior MRI and are greatest in the right occipital lobe. The ventricles and sulci are within normal limits for age. No abnormal enhancement is identified. Vascular: Major intracranial vascular flow voids are preserved. Skull and upper cervical spine: Unremarkable bone marrow signal. Sinuses/Orbits: Unremarkable orbits. Paranasal  sinuses and  mastoid air cells are clear. Other: None. IMPRESSION: 1. No acute intracranial abnormality. 2. Greatly progressive chronic small vessel ischemic disease since 2015 with multiple chronic lacunar infarcts. 3. Increased number of chronic cerebral microhemorrhages which could be related to chronic hypertension, vasculitis, or cerebral amyloid angiopathy. Electronically Signed   By: Logan Bores M.D.   On: 04/29/2020 18:53    Time Spent in minutes  30   Lala Lund M.D on 04/30/2020 at 10:29 AM  To page go to [email or web address] - password [email or web address]

## 2020-04-30 NOTE — Progress Notes (Signed)
Occupational Therapy Evaluation and Discharge Patient Details Name: Jordan Logan MRN: [id number] DOB: Sep 11, 1956 Today's Date: 04/30/2020    History of Present Illness 64 y.o. 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  He reports only reason he came in was because of double vision, which has resolved.  No current visual deficits.  Patient demonstrated independence with ADLs and mobility.  UE strength WFL and symmetrical, no sensation impairment. Discussed with patient importance of taking BP medications as prescribed and other lifestyle activities he can do to help keep BP under control.  No further OT needs at this time.      Follow Up Recommendations  No OT follow up    Equipment Recommendations  None recommended by OT    Recommendations for Other Services       Precautions / Restrictions Precautions Precautions: Other (comment) Precaution Comments: watch BP Restrictions Weight Bearing Restrictions: No      Mobility Bed Mobility Overal bed mobility: Independent                Transfers Overall transfer level: Independent                    Balance Overall balance assessment: No apparent balance deficits (not formally assessed)                                       ADL either performed or assessed with clinical judgement   ADL Overall ADL's : Needs assistance/impaired                                     Functional mobility during ADLs: Independent General ADL Comments: Patient supervision for all ADLs during eval for safety, though demonstrated  independence      Vision Baseline Vision/History: No visual deficits Patient Visual Report: No change from baseline;Other (comment) Vision Assessment?: No apparent visual deficits Additional Comments: Patient reports double vision is reason he came to hospital, though has resolved since then.  Actually stated vision is clearer than it has ever been.     Perception     Praxis      Pertinent Vitals/Pain Pain Assessment: No/denies pain     Hand Dominance Right   Extremity/Trunk Assessment Upper Extremity Assessment Upper Extremity Assessment: Overall WFL for tasks assessed (No sensation loss, symmetrical UE strength)   Lower Extremity Assessment Lower Extremity Assessment: Defer to PT evaluation   Cervical / Trunk Assessment Cervical / Trunk Assessment: Normal   Communication Communication Communication: No difficulties   Cognition Arousal/Alertness: Awake/alert Behavior During Therapy: WFL for tasks assessed/performed Overall Cognitive Status: Within Functional Limits for tasks assessed                                     General Comments      Exercises     Shoulder Instructions  Home Living Family/patient expects to be discharged to:: Private residence Living Arrangements: Spouse/significant other Available Help at Discharge: Family Type of Home: Apartment Home Access: Level entry     Home Layout: One level     Bathroom Shower/Tub: Tub/shower unit         Home Equipment: Grab bars - tub/shower          Prior Functioning/Environment Level of Independence: Independent        Comments: Pretty active, enjoys exercise. Takes care of wife.        OT Problem List: Decreased knowledge of precautions      OT Treatment/Interventions:      OT Goals(Current goals can be found in the care plan section) Acute Rehab OT Goals Patient Stated Goal: lose 30 lbs to help decr BP OT Goal Formulation: With patient Time For Goal  Achievement: 05/14/20 Potential to Achieve Goals: Good  OT Frequency:     Barriers to D/C:            Co-evaluation              AM-PAC OT "6 Clicks" Daily Activity     Outcome Measure Help from another person eating meals?: None Help from another person taking care of personal grooming?: None Help from another person toileting, which includes using toliet, bedpan, or urinal?: None Help from another person bathing (including washing, rinsing, drying)?: None Help from another person to put on and taking off regular upper body clothing?: None Help from another person to put on and taking off regular lower body clothing?: None 6 Click Score: 24   End of Session Nurse Communication: Mobility status  Activity Tolerance: Patient tolerated treatment well Patient left: in bed;with call bell/phone within reach  OT Visit Diagnosis: Other symptoms and signs involving the nervous system (V40.981)                Time: 1914-7829 OT Time Calculation (min): 14 min Charges:  OT General Charges $OT Visit: 1 Visit OT Evaluation $OT Eval Low Complexity: 1 Low  Jordan Logan, OTR/L   Jordan Logan 04/30/2020, 3:29 PM

## 2020-04-30 NOTE — Evaluation (Signed)
Physical Therapy Evaluation and Discharge Patient Details Name: Jordan Logan MRN: [id number] DOB: October 05, 1956 Today's Date: 04/30/2020   History of Present Illness  64 y.o. 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 All education has been completed and the patient has no further questions. Focused education on effects of exercise (discussed aerobic vs anaerobic vs strength training vs stretching--pt likes to do all these types) on BP and overall cardiovascular health. Demonstrated to pt how during a single bout of activity (PT balance assessment) his BP increased. Again educated on effects and risks of elevated BP. RN made aware of BP 156/118. PT is signing off. Thank you for this referral.     Follow Up Recommendations No PT follow up    Equipment Recommendations  None recommended by PT    Recommendations for Other Services       Precautions / Restrictions Precautions Precautions: Other (comment) Precaution Comments: watch BP      Mobility  Bed Mobility Overal bed mobility: Independent                Transfers Overall transfer level: Independent                  Ambulation/Gait Ambulation/Gait assistance: Independent Gait Distance (Feet): 30 Feet Assistive device: None Gait Pattern/deviations: WFL(Within Functional Limits)     General Gait Details: limited by incr BP  Stairs            Wheelchair Mobility    Modified Rankin (Stroke Patients Only) Modified Rankin (Stroke Patients Only) Pre-Morbid Rankin Score: No symptoms Modified Rankin: No symptoms     Balance Overall balance  assessment: Independent                               Standardized Balance Assessment Standardized Balance Assessment : Berg Balance Test Berg Balance Test Sit to Stand: Able to stand without using hands and stabilize independently Standing Unsupported: Able to stand safely 2 minutes Sitting with Back Unsupported but Feet Supported on Floor or Stool: Able to sit safely and securely 2 minutes Stand to Sit: Sits safely with minimal use of hands Transfers: Able to transfer safely, minor use of hands Standing Unsupported with Eyes Closed: Able to stand 10 seconds safely Standing Ubsupported with Feet Together: Able to place feet together independently and stand 1 minute safely From Standing, Reach Forward with Outstretched Arm: Can reach confidently >25 cm (10") From Standing Position, Pick up Object from Floor: Able to pick up shoe safely and easily From Standing Position, Turn to Look Behind Over each Shoulder: Looks behind from both sides and weight shifts well Turn 360 Degrees: Able to turn 360 degrees safely in 4 seconds or less Standing Unsupported, Alternately Place Feet on Step/Stool: Able to stand independently and safely and complete 8 steps in 20 seconds Standing Unsupported, One Foot in Front: Able to place foot tandem independently and hold 30 seconds Standing on One Leg: Able to lift leg independently and hold > 10 seconds Total Score: 56         Pertinent Vitals/Pain Pain Assessment:  No/denies pain    Home Living Family/patient expects to be discharged to:: Private residence Living Arrangements: Spouse/significant other Available Help at Discharge: Family Type of Home: Apartment Home Access: Level entry     Home Layout: One level Home Equipment: Grab bars - tub/shower      Prior Function Level of Independence: Independent         Comments: Pretty active, enjoys exercise. Takes care of wife.     Hand Dominance   Dominant Hand: Right     Extremity/Trunk Assessment   Upper Extremity Assessment Upper Extremity Assessment: Defer to OT evaluation    Lower Extremity Assessment Lower Extremity Assessment: Overall WFL for tasks assessed (has history of RLE injury/pulled muscle from karate)    Cervical / Trunk Assessment Cervical / Trunk Assessment: Normal  Communication   Communication: No difficulties  Cognition Arousal/Alertness: Awake/alert Behavior During Therapy: WFL for tasks assessed/performed Overall Cognitive Status: Within Functional Limits for tasks assessed                                        General Comments General comments (skin integrity, edema, etc.): Educated pt on SBP vs DBP and normal response with incr activity and normal response with aerobic activity over time. Patient's DBP incr from 101 to 118 with balance testing and minimal walking. RN made aware. Educated on additional benefits to aerobic exercise (lower BP, weight loss, stress management, improve circulatory system).     Exercises     Assessment/Plan    PT Assessment Patent does not need any further PT services  PT Problem List         PT Treatment Interventions      PT Goals (Current goals can be found in the Care Plan section)  Acute Rehab PT Goals Patient Stated Goal: lose 30 lbs to help decr BP PT Goal Formulation: All assessment and education complete, DC therapy    Frequency     Barriers to discharge        Co-evaluation               AM-PAC PT "6 Clicks" Mobility  Outcome Measure Help needed turning from your back to your side while in a flat bed without using bedrails?: None Help needed moving from lying on your back to sitting on the side of a flat bed without using bedrails?: None Help needed moving to and from a bed to a chair (including a wheelchair)?: None Help needed standing up from a chair using your arms (e.g., wheelchair or bedside chair)?: None Help needed to walk in hospital  room?: None Help needed climbing 3-5 steps with a railing? : None 6 Click Score: 24    End of Session   Activity Tolerance: Treatment limited secondary to medical complications (Comment) (elevated DBP with minimal activity) Patient left: in bed;with call bell/phone within reach Nurse Communication: Mobility status;Other (comment) (BP 156/118) PT Visit Diagnosis: Other symptoms and signs involving the nervous system (O29.476)    Time: 5465-0354 PT Time Calculation (min) (ACUTE ONLY): 28 min   Charges:   PT Evaluation $PT Eval Low Complexity: 1 Low PT Treatments $Self Care/Home Management: 8-22         Jerolyn Center, PT Pager [phone]   Zena Amos 04/30/2020, 3:15 PM

## 2020-04-30 NOTE — Plan of Care (Signed)
Education provided

## 2020-04-30 NOTE — Progress Notes (Signed)
  Echocardiogram 2D Echocardiogram has been performed.  Jordan Logan 04/30/2020, 3:44 PM

## 2020-05-01 ENCOUNTER — Inpatient Hospital Stay (HOSPITAL_COMMUNITY): Payer: Medicare (Managed Care)

## 2020-05-01 LAB — HEMOGLOBIN A1C
Hgb A1c MFr Bld: 5.6 % (ref 4.8–5.6)
Mean Plasma Glucose: 114 mg/dL

## 2020-05-01 MED ORDER — HYDRALAZINE HCL 50 MG PO TABS: 100.0000 mg | ORAL_TABLET | Freq: Three times a day (TID) | ORAL | Status: DC

## 2020-05-01 MED ORDER — ASPIRIN EC 81 MG PO TBEC
81.0000 mg | DELAYED_RELEASE_TABLET | Freq: Every day | ORAL | 0 refills | Status: AC
Start: 2020-05-01 — End: ?

## 2020-05-01 MED ORDER — ISOSORBIDE MONONITRATE ER 30 MG PO TB24: 30.0000 mg | ORAL_TABLET | Freq: Every day | ORAL | 0 refills | Status: AC

## 2020-05-01 MED ORDER — ISOSORBIDE MONONITRATE ER 30 MG PO TB24
30.0000 mg | ORAL_TABLET | Freq: Every day | ORAL | Status: DC
  Administered 2020-05-01: 30 mg via ORAL
  Filled 2020-05-01: qty 1

## 2020-05-01 MED ORDER — HYDRALAZINE HCL 100 MG PO TABS: 100.0000 mg | ORAL_TABLET | Freq: Three times a day (TID) | ORAL | 0 refills | Status: AC

## 2020-05-01 MED ORDER — AMLODIPINE BESYLATE 10 MG PO TABS: 10.0000 mg | ORAL_TABLET | Freq: Every day | ORAL | 0 refills | Status: AC

## 2020-05-01 NOTE — Discharge Summary (Signed)
[name]:[id number] DOB: [birth year] DOA: 04/29/2020  PCP: Patient, No Pcp Per  Admit date: 04/29/2020  Discharge date: 05/01/2020  Admitted From: Home   Disposition: Home   Recommendations for Outpatient Follow-up:   Follow up with PCP in 1-2 weeks  PCP Please obtain BMP/CBC, 2 view CXR in 1week,  (see Discharge instructions)   PCP Please follow up on the following pending results: Follow BP, BMP, CBC   Home Health: None   Equipment/Devices: None  Consultations: Neuro Dr Leonie Man over the phone Discharge Condition: Stable    CODE STATUS: Full    Diet Recommendation: Heart Healthy   Diet Order            Diet - low sodium heart healthy           DIET SOFT Room service appropriate? 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 ED with diplopia, work-up so far suggestive of hypertensive crisis with AKI and possible hypertensive encephalopathy.                                                                 Hospital Course    1.  Hypertensive crisis with hypertensive encephalopathy AKI.  Due to poorly controlled blood pressure, stopped taking his blood pressure medications 5 years ago, thankfully MRI is nonacute, will gradually reintroduce blood pressure medications goal of normalizing over the next 2 to 3 weeks.  Deficits have resolved, echo nonacute with preserved EF of 55% and grade 1 chronic diastolic CHF,case  discussed with stroke MD Dr. Leonie Man on 04/30/2020, no further neuro work-up.  Pressure improved have placed him on appropriate medications with gradual up titration, note patient refused all blood work today, refused chest x-ray and refused to stay in the hospital for any further work-up, he was warned that doing so can cause stroke, death disability.  He resumes all responsibility for his actions.  Have requested him and his significant other Lattie Haw to follow with PCP within a week.  2.  AKI versus CKD 3.  Due to #1 above.  Last renal function 5 years ago, this could very well become his new baseline.    Normal UA and ultrasound, he refused repeat lab work today hence we do not know if his renal function improved or not..  3.  History of stroke in the  past.  Continue aspirin and statin.  He refused lipid panel.  4.  Noncompliance with medications, lab work and physician follow-up.  Counseled.  5.  Possibly incidental COVID-19 positive infection.  No symptoms, CRP stable, refused chest x-ray or repeat CRP.  Told him to quarantine for 2 weeks.  However he states he does not believe he has a true infection.    TTE  1. Normal LV systolic function; moderate LVH; grade 1 diastolic  dysfunction.  2. Left ventricular ejection fraction, by estimation, is 50 to 55%. The  left ventricle has low normal function. The left ventricle has no regional  wall motion abnormalities. There is moderate left ventricular hypertrophy.  Left ventricular diastolic  parameters are consistent with Grade I diastolic dysfunction (impaired  relaxation).  3. Right ventricular systolic function is normal. The right ventricular  size is normal.  4. The mitral valve is normal in structure. Trivial mitral valve  regurgitation. No evidence of mitral stenosis.  5. The aortic valve is tricuspid. Aortic valve regurgitation is trivial.  Mild aortic valve sclerosis is present, with no evidence of aortic valve  stenosis.  6.  The inferior vena cava is normal in size with greater than 50%  respiratory variability, suggesting right atrial pressure of 3 mmHg.   Lab Results  Component Value Date   HGBA1C 5.6 04/30/2020   COVID-19 Labs  Recent Labs    04/29/20 0614  CRP <0.5    Lab Results  Component Value Date   SARSCOV2NAA POSITIVE (A) 04/29/2020    Discharge diagnosis     Active Problems:   Stroke [facility])    Discharge instructions    Discharge Instructions    Diet - low sodium heart healthy   Complete by: As directed    Discharge instructions   Complete by: As directed    You have refused chest x-ray and lab work, you are responsible for any adverse effects that can happen with prescribed medications or lack of information due to refused work-up.  You were clearly told that your decision to do so can result in kidney failure, stroke, death or disability.  You have resumed all responsibility for your actions.  Follow with Primary MD  in 7 days   Get CBC, CMP, 2 view Chest X ray -  checked next visit within 1 week by Primary MD   Activity: As tolerated with Full fall precautions use walker/cane & assistance as needed  Disposition Home    Diet: Heart Healthy    Special Instructions: If you have smoked or chewed Tobacco  in the last 2 yrs please stop smoking, stop any regular Alcohol  and or any Recreational drug use.  On your next visit with your primary care physician please Get Medicines reviewed and adjusted.  Please request your Prim.MD to go over all Hospital Tests and Procedure/Radiological results at the follow up, please get all Hospital records sent to your Prim MD by signing hospital release before you go home.  If you experience worsening of your admission symptoms, develop shortness of breath, life threatening emergency, suicidal or homicidal thoughts you must seek medical attention immediately by calling 911 or calling your MD immediately  if symptoms less severe.  You Must  read complete instructions/literature along with all the possible adverse reactions/side effects for all the Medicines you take and that have been prescribed to you. Take any new Medicines after you have completely understood and accpet all the possible adverse reactions/side effects.   Do not  drive, operate heavy machinery, perform activities at heights, swimming or participation in water activities or provide baby sitting services if your were admitted for syncope or siezures until you have seen by Primary MD or a Neurologist and advised to do so again.  Do not drive when taking Pain medications.  Do not take more than prescribed Pain, Sleep and Anxiety Medications  Wear Seat belts while driving.   Please note  You were cared for by a hospitalist during your hospital stay. If you have any questions about your discharge medications or the care you received while you were in the hospital after you are discharged, you can call the unit and asked to speak with the hospitalist on call if the hospitalist that took care of you is not available. Once you are discharged, your primary care physician will handle any further medical issues. Please note that NO REFILLS for any discharge medications will be authorized once you are discharged, as it is imperative that you return to your primary care physician (or establish a relationship with a primary care physician if you do not have one) for your aftercare needs so that they can reassess your need for medications and monitor your lab values.   Increase activity slowly   Complete by: As directed    MyChart COVID-19 home monitoring program   Complete by: May 01, 2020    Is the patient willing to use the [facility] for home monitoring?: Yes   Temperature monitoring   Complete by: May 01, 2020    After how many days would you like to receive a notification of this patient's flowsheet entries?: 1      Discharge Medications   Allergies as of  05/01/2020   No Known Allergies     Medication List    TAKE these medications   amLODipine 10 MG tablet Commonly known as: NORVASC Take 1 tablet (10 mg total) by mouth daily. Start taking on: May 02, 2020   aspirin EC 81 MG tablet Take 1 tablet (81 mg total) by mouth daily.   hydrALAZINE 100 MG tablet Commonly known as: APRESOLINE Take 1 tablet (100 mg total) by mouth every 8 (eight) hours.   isosorbide mononitrate 30 MG 24 hr tablet Commonly known as: IMDUR Take 1 tablet (30 mg total) by mouth daily. Start taking on: May 02, 2020        Follow-up Pierre Part. Schedule an appointment as soon as possible for a visit in 1 week(s).   Specialty: Internal Medicine Contact information: [id number] [place] [phone]              Major procedures and Radiology Reports - PLEASE review detailed and final reports thoroughly  -       CT Angio Head W or Wo Contrast  Result Date: 04/29/2020 CLINICAL DATA:  Diplopia since 3 p.m. yesterday. Personal history of CVA 2014. EXAM: CT ANGIOGRAPHY HEAD AND NECK TECHNIQUE: Multidetector CT imaging of the head and neck was performed using the standard protocol during bolus administration of intravenous contrast. Multiplanar CT image reconstructions and MIPs were obtained to evaluate the vascular anatomy. Carotid stenosis measurements (when applicable) are obtained utilizing NASCET criteria, using the distal internal carotid diameter as the denominator. CONTRAST:  7m OMNIPAQUE IOHEXOL 350 MG/ML SOLN COMPARISON:  None. MR head without contrast 06/27/14 FINDINGS: Brain: Progressive white matter changes are seen bilaterally, left greater than right. Lacunar  infarct involving the left internal capsule appears remote. Remote infarct of the left putamen is again noted. Basal ganglia are otherwise intact. Insular ribbon is normal bilaterally. Thalami are  within normal limits. Brainstem and cerebellum are normal. No acute infarct, hemorrhage, or mass lesion is present. The ventricles are of normal size. No significant extraaxial fluid collection is present. Vascular: No hyperdense vessel or unexpected calcification. Skull: Calvarium is intact. No focal lytic or blastic lesions are present. No significant extracranial soft tissue lesion is present. Sinuses: The paranasal sinuses and mastoid air cells are clear. Orbits: The globes and orbits are within normal limits. CTA NECK FINDINGS Aortic arch: Common origin of the left common carotid and innominate artery is noted, a normal variant. No significant atherosclerotic disease or stenosis is present. Right carotid system: The right common carotid artery is within normal limits. Bifurcation is unremarkable. Cervical right ICA is normal. Left carotid system: The left common carotid artery is within normal limits. Bifurcation is unremarkable. Cervical left ICA is normal. Vertebral arteries: The left vertebral artery is the dominant vessel. Both vertebral arteries originate from the subclavian arteries without significant stenosis. No significant stenosis is present in either vertebral artery in the neck. Skeleton: Multilevel degenerative changes are noted. Foraminal narrowing is greatest on the right at C3-4 and on the left at C6-7. Vertebral body heights are maintained. No focal lytic or blastic lesions are present. Other neck: The soft tissues of the neck are within normal limits. No significant adenopathy is present. Salivary glands are within normal limits. Thyroid is unremarkable. Upper chest: The lung apices are clear. Review of the MIP images confirms the above findings CTA HEAD FINDINGS Anterior circulation: The internal carotid arteries are within normal limits from the high cervical segments through the ICA termini bilaterally. The A1 and M1 segments are normal. MCA bifurcations are within normal limits. The ACA  and MCA branch vessels are normal. Posterior circulation: The left vertebral artery is dominant. Dominant AICA vessels are present bilaterally. The basilar artery is normal. A right scratched at the right posterior cerebral artery receives equal contribution from the right P1 segment and posterior communicating artery. Smaller left posterior communicating artery is present with a dominant left P1 segment. PCA branch vessels are within normal limits bilaterally. Venous sinuses: The dural sinuses are patent. The straight sinus and deep cerebral veins are intact. Cortical veins are within normal limits. Anatomic variants: Fetal type right posterior cerebral artery. Review of the MIP images confirms the above findings IMPRESSION: 1. Normal variant CTA Circle of Willis without significant proximal stenosis, aneurysm, or branch vessel occlusion. 2. Normal CTA of the neck. 3. Progressive white matter disease bilaterally, left greater than right. This may be related to chronic microvascular ischemia. 4. Remote lacunar infarcts of the left internal capsule and left putamen. 5. Multilevel spondylosis of the cervical spine. Electronically Signed   By: San Morelle M.D.   On: 04/29/2020 10:06   CT Angio Neck W and/or Wo Contrast  Result Date: 04/29/2020 CLINICAL DATA:  Diplopia since 3 p.m. yesterday. Personal history of CVA 2014. EXAM: CT ANGIOGRAPHY HEAD AND NECK TECHNIQUE: Multidetector CT imaging of the head and neck was performed using the standard protocol during bolus administration of intravenous contrast. Multiplanar CT image reconstructions and MIPs were obtained to evaluate the vascular anatomy. Carotid stenosis measurements (when applicable) are obtained utilizing NASCET criteria, using the distal internal carotid diameter as the denominator. CONTRAST:  49m OMNIPAQUE IOHEXOL 350 MG/ML SOLN COMPARISON:  None. MR head  without contrast 06/27/14 FINDINGS: Brain: Progressive white matter changes are seen  bilaterally, left greater than right. Lacunar infarct involving the left internal capsule appears remote. Remote infarct of the left putamen is again noted. Basal ganglia are otherwise intact. Insular ribbon is normal bilaterally. Thalami are within normal limits. Brainstem and cerebellum are normal. No acute infarct, hemorrhage, or mass lesion is present. The ventricles are of normal size. No significant extraaxial fluid collection is present. Vascular: No hyperdense vessel or unexpected calcification. Skull: Calvarium is intact. No focal lytic or blastic lesions are present. No significant extracranial soft tissue lesion is present. Sinuses: The paranasal sinuses and mastoid air cells are clear. Orbits: The globes and orbits are within normal limits. CTA NECK FINDINGS Aortic arch: Common origin of the left common carotid and innominate artery is noted, a normal variant. No significant atherosclerotic disease or stenosis is present. Right carotid system: The right common carotid artery is within normal limits. Bifurcation is unremarkable. Cervical right ICA is normal. Left carotid system: The left common carotid artery is within normal limits. Bifurcation is unremarkable. Cervical left ICA is normal. Vertebral arteries: The left vertebral artery is the dominant vessel. Both vertebral arteries originate from the subclavian arteries without significant stenosis. No significant stenosis is present in either vertebral artery in the neck. Skeleton: Multilevel degenerative changes are noted. Foraminal narrowing is greatest on the right at C3-4 and on the left at C6-7. Vertebral body heights are maintained. No focal lytic or blastic lesions are present. Other neck: The soft tissues of the neck are within normal limits. No significant adenopathy is present. Salivary glands are within normal limits. Thyroid is unremarkable. Upper chest: The lung apices are clear. Review of the MIP images confirms the above findings CTA HEAD  FINDINGS Anterior circulation: The internal carotid arteries are within normal limits from the high cervical segments through the ICA termini bilaterally. The A1 and M1 segments are normal. MCA bifurcations are within normal limits. The ACA and MCA branch vessels are normal. Posterior circulation: The left vertebral artery is dominant. Dominant AICA vessels are present bilaterally. The basilar artery is normal. A right scratched at the right posterior cerebral artery receives equal contribution from the right P1 segment and posterior communicating artery. Smaller left posterior communicating artery is present with a dominant left P1 segment. PCA branch vessels are within normal limits bilaterally. Venous sinuses: The dural sinuses are patent. The straight sinus and deep cerebral veins are intact. Cortical veins are within normal limits. Anatomic variants: Fetal type right posterior cerebral artery. Review of the MIP images confirms the above findings IMPRESSION: 1. Normal variant CTA Circle of Willis without significant proximal stenosis, aneurysm, or branch vessel occlusion. 2. Normal CTA of the neck. 3. Progressive white matter disease bilaterally, left greater than right. This may be related to chronic microvascular ischemia. 4. Remote lacunar infarcts of the left internal capsule and left putamen. 5. Multilevel spondylosis of the cervical spine. Electronically Signed   By: San Morelle M.D.   On: 04/29/2020 10:06   MR BRAIN W WO CONTRAST  Result Date: 04/29/2020 CLINICAL DATA:  Diplopia.  COVID-19 positive. EXAM: MRI HEAD WITHOUT AND WITH CONTRAST TECHNIQUE: Multiplanar, multiecho pulse sequences of the brain and surrounding structures were obtained without and with intravenous contrast. CONTRAST:  90m GADAVIST GADOBUTROL 1 MMOL/ML IV SOLN COMPARISON:  Head and neck CTA 04/29/2020 and head MRI 06/27/2014 FINDINGS: Brain: No acute infarct, mass, midline shift, or extra-axial fluid collection is  identified. Patchy T2 hyperintensities  in the cerebral white matter bilaterally have greatly progressed from the prior MRI and are nonspecific but compatible with moderate chronic small vessel ischemic disease. There is unchanged encephalomalacia in the posterior left basal ganglia related to a remote hemorrhage. Chronic lacunar infarcts are present more anteriorly in the left basal ganglia and corona radiata and are new from the prior MRI. A chronic left paracentral pontine infarct is also new. There are multiple foci of chronic microhemorrhage peripherally in both cerebral hemispheres which have increased in number from the prior MRI and are greatest in the right occipital lobe. The ventricles and sulci are within normal limits for age. No abnormal enhancement is identified. Vascular: Major intracranial vascular flow voids are preserved. Skull and upper cervical spine: Unremarkable bone marrow signal. Sinuses/Orbits: Unremarkable orbits. Paranasal sinuses and mastoid air cells are clear. Other: None. IMPRESSION: 1. No acute intracranial abnormality. 2. Greatly progressive chronic small vessel ischemic disease since 2015 with multiple chronic lacunar infarcts. 3. Increased number of chronic cerebral microhemorrhages which could be related to chronic hypertension, vasculitis, or cerebral amyloid angiopathy. Electronically Signed   By: Logan Bores M.D.   On: 04/29/2020 18:53   US RENAL  Result Date: 04/30/2020 CLINICAL DATA:  Acute kidney injury. EXAM: RENAL / URINARY TRACT ULTRASOUND COMPLETE COMPARISON:  None. FINDINGS: Right Kidney: Renal measurements: 11.7 x 5.9 x 5.0 cm = volume: 178 mL . Echogenicity within normal limits. No mass or hydronephrosis visualized. Left Kidney: Renal measurements: 11.7 x 6.1 x 5.0 cm = volume: 184 mL. Echogenicity within normal limits. No mass or hydronephrosis visualized. Bladder: Appears normal for degree of bladder distention. Other: None. IMPRESSION: Normal renal ultrasound.  Electronically Signed   By: Marijo Conception M.D.   On: 04/30/2020 16:21   ECHOCARDIOGRAM COMPLETE  Result Date: 04/30/2020    ECHOCARDIOGRAM REPORT   Patient Name:   Jordan Logan Date of Exam: 04/30/2020 Medical Rec #:  [id number]          Height:       70.0 in Accession #:    [id number]         Weight:       230.6 lb Date of Birth:  June 06, 1956           BSA:          2.218 m Patient Age:    64 years           BP:           150/99 mmHg Patient Gender: M                  HR:           71 bpm. Exam Location:  Inpatient Procedure: 2D Echo, Cardiac Doppler and Color Doppler Indications:    Stroke  History:        Patient has no prior history of Echocardiogram examinations.                 Stroke; Risk Factors:Hypertension. Covid 19 positive.  Sonographer:    Roseanna Rainbow RDCS Referring Phys: [id number] [place]  1. Normal LV systolic function; moderate LVH; grade 1 diastolic dysfunction.  2. Left ventricular ejection fraction, by estimation, is 50 to 55%. The left ventricle has low normal function. The left ventricle has no regional wall motion abnormalities. There is moderate left ventricular hypertrophy. Left ventricular diastolic parameters are consistent with Grade I diastolic dysfunction (impaired relaxation).  3. Right ventricular systolic function is normal.  The right ventricular size is normal.  4. The mitral valve is normal in structure. Trivial mitral valve regurgitation. No evidence of mitral stenosis.  5. The aortic valve is tricuspid. Aortic valve regurgitation is trivial. Mild aortic valve sclerosis is present, with no evidence of aortic valve stenosis.  6. The inferior vena cava is normal in size with greater than 50% respiratory variability, suggesting right atrial pressure of 3 mmHg. FINDINGS  Left Ventricle: Left ventricular ejection fraction, by estimation, is 50 to 55%. The left ventricle has low normal function. The left ventricle has no regional wall motion abnormalities. The left  ventricular internal cavity size was normal in size. There is moderate left ventricular hypertrophy. Left ventricular diastolic parameters are consistent with Grade I diastolic dysfunction (impaired relaxation). Right Ventricle: The right ventricular size is normal. Right ventricular systolic function is normal. Left Atrium: Left atrial size was normal in size. Right Atrium: Right atrial size was normal in size. Pericardium: There is no evidence of pericardial effusion. Mitral Valve: The mitral valve is normal in structure. Normal mobility of the mitral valve leaflets. Mild mitral annular calcification. Trivial mitral valve regurgitation. No evidence of mitral valve stenosis. Tricuspid Valve: The tricuspid valve is normal in structure. Tricuspid valve regurgitation is trivial. No evidence of tricuspid stenosis. Aortic Valve: The aortic valve is tricuspid. Aortic valve regurgitation is trivial. Mild aortic valve sclerosis is present, with no evidence of aortic valve stenosis. Pulmonic Valve: The pulmonic valve was normal in structure. Pulmonic valve regurgitation is not visualized. No evidence of pulmonic stenosis. Aorta: The aortic root is normal in size and structure. Venous: The inferior vena cava is normal in size with greater than 50% respiratory variability, suggesting right atrial pressure of 3 mmHg. IAS/Shunts: No atrial level shunt detected by color flow Doppler. Additional Comments: Normal LV systolic function; moderate LVH; grade 1 diastolic dysfunction.  LEFT VENTRICLE PLAX 2D LVIDd:         5.00 cm      Diastology LVIDs:         3.10 cm      LV e' lateral:   6.20 cm/s LV PW:         1.70 cm      LV E/e' lateral: 9.5 LV IVS:        1.60 cm      LV e' medial:    5.55 cm/s LVOT diam:     2.10 cm      LV E/e' medial:  10.6 LV SV:         86 LV SV Index:   39 LVOT Area:     3.46 cm  LV Volumes (MOD) LV vol d, MOD A2C: 101.0 ml LV vol d, MOD A4C: 105.0 ml LV vol s, MOD A2C: 30.8 ml LV vol s, MOD A4C: 40.9 ml  LV SV MOD A2C:     70.2 ml LV SV MOD A4C:     105.0 ml LV SV MOD BP:      70.0 ml RIGHT VENTRICLE             IVC RV S prime:     19.60 cm/s  IVC diam: 1.90 cm LEFT ATRIUM             Index       RIGHT ATRIUM           Index LA diam:        3.70 cm 1.67 cm/m  RA Area:  15.00 cm LA Vol (A2C):   47.8 ml 21.56 ml/m RA Volume:   37.70 ml  17.00 ml/m LA Vol (A4C):   28.7 ml 12.94 ml/m LA Biplane Vol: 37.4 ml 16.87 ml/m  AORTIC VALVE LVOT Vmax:   142.00 cm/s LVOT Vmean:  106.000 cm/s LVOT VTI:    0.248 m  AORTA Ao Root diam: 3.70 cm MITRAL VALVE MV Area (PHT): 2.66 cm    SHUNTS MV Decel Time: 285 msec    Systemic VTI:  0.25 m MV E velocity: 59.10 cm/s  Systemic Diam: 2.10 cm MV A velocity: 58.10 cm/s MV E/A ratio:  1.02 Kirk Ruths MD Electronically signed by Kirk Ruths MD Signature Date/Time: 04/30/2020/4:33:55 PM    Final     Micro Results     Recent Results (from the past 240 hour(s))  SARS Coronavirus 2 by RT PCR (hospital order, performed in [place] hospital lab) Nasopharyngeal Nasopharyngeal Swab     Status: Abnormal   Collection Time: 04/29/20 10:22 AM   Specimen: Nasopharyngeal Swab  Result Value Ref Range Status   SARS Coronavirus 2 POSITIVE (A) NEGATIVE Final    Comment: RESULT CALLED TO, READ BACK BY AND VERIFIED WITH: TAI,M RN _0  ON 04/29/20 JACKSON,K (NOTE) SARS-CoV-2 target nucleic acids are DETECTED  SARS-CoV-2 RNA is generally detectable in upper respiratory specimens  during the acute phase of infection.  Positive results are indicative  of the presence of the identified virus, but do not rule out bacterial infection or co-infection with other pathogens not detected by the test.  Clinical correlation with patient history and  other diagnostic information is necessary to determine patient infection status.  The expected result is negative.  Fact Sheet for Patients:   StrictlyIdeas.no   Fact Sheet for Healthcare Providers:    BankingDealers.co.za    This test is not yet approved or cleared by the [place] FDA and  has been authorized for detection and/or diagnosis of SARS-CoV-2 by FDA under an Emergency Use Authorization (EUA).  This EUA will remain in effect (meaning this t est can be used) for the duration of  the COVID-19 declaration under Section 564(b)(1) of the Act, 21 U.S.C. section 360-bbb-3(b)(1), unless the authorization is terminated or revoked sooner.  Performed at [facility], [place] [address], [place], [place] [address]     Today   Subjective    Jordan Logan today has no headache,no chest abdominal pain,no new weakness tingling or numbness, feels much better wants to go home today.    Objective   Blood pressure (!) 153/97, pulse 70, temperature 97.7 F (36.5 C), resp. rate 20, height _1  (1.778 m), weight 104.6 kg, SpO2 94 %.   Intake/Output Summary (Last 24 hours) at 05/01/2020 0900 Last data filed at 04/30/2020 1319 Gross per 24 hour  Intake 240 ml  Output 350 ml  Net -110 ml    Exam  Awake Alert, No new F.N deficits, Normal affect [place].AT,PERRAL Supple Neck,No JVD, No cervical lymphadenopathy appriciated.  Symmetrical Chest wall movement, Good air movement bilaterally, CTAB RRR,No Gallops,Rubs or new Murmurs, No Parasternal Heave +ve B.Sounds, Abd Soft, Non tender, No organomegaly appriciated, No rebound -guarding or rigidity. No Cyanosis, Clubbing or edema, No new Rash or bruise   Data Review   CBC w Diff:  Lab Results  Component Value Date   WBC 4.7 04/29/2020   HGB 15.4 04/29/2020   HCT 45.9 04/29/2020   PLT 178 04/29/2020   LYMPHOPCT 36 04/29/2020  MONOPCT 8 04/29/2020   EOSPCT 2 04/29/2020   BASOPCT 1 04/29/2020    CMP:  Lab Results  Component Value Date   NA 137 04/29/2020   K 3.4 (L) 04/29/2020   CL 101 04/29/2020   CO2 27 04/29/2020   BUN 12 04/29/2020   CREATININE 1.35 (H) 04/29/2020   .   Total Time in preparing paper work, data evaluation and todays exam - 48 minutes  Lala Lund M.D on 05/01/2020 at 9:00 AM  [facility]   Office  [phone]

## 2020-05-01 NOTE — Discharge Instructions (Signed)
You have refused chest x-ray and lab work, you are responsible for any adverse effects that can happen with prescribed medications or lack of information due to refused work-up.  You were clearly told that your decision to do so can result in kidney failure, stroke, death or disability.  You have resumed all responsibility for your actions.  Follow with Primary MD  in 7 days   Get CBC, CMP, 2 view Chest X ray -  checked next visit within 1 week by Primary MD   Activity: As tolerated with Full fall precautions use walker/cane & assistance as needed  Disposition Home    Diet: Heart Healthy    Special Instructions: If you have smoked or chewed Tobacco  in the last 2 yrs please stop smoking, stop any regular Alcohol  and or any Recreational drug use.  On your next visit with your primary care physician please Get Medicines reviewed and adjusted.  Please request your Prim.MD to go over all Hospital Tests and Procedure/Radiological results at the follow up, please get all Hospital records sent to your Prim MD by signing hospital release before you go home.  If you experience worsening of your admission symptoms, develop shortness of breath, life threatening emergency, suicidal or homicidal thoughts you must seek medical attention immediately by calling 911 or calling your MD immediately  if symptoms less severe.  You Must read complete instructions/literature along with all the possible adverse reactions/side effects for all the Medicines you take and that have been prescribed to you. Take any new Medicines after you have completely understood and accpet all the possible adverse reactions/side effects.   Do not drive, operate heavy machinery, perform activities at heights, swimming or participation in water activities or provide baby sitting services if your were admitted for syncope or siezures until you have seen by Primary MD or a Neurologist and advised to do so again.  Do not drive when taking  Pain medications.  Do not take more than prescribed Pain, Sleep and Anxiety Medications  Wear Seat belts while driving.   Please note  You were cared for by a hospitalist during your hospital stay. If you have any questions about your discharge medications or the care you received while you were in the hospital after you are discharged, you can call the unit and asked to speak with the hospitalist on call if the hospitalist that took care of you is not available. Once you are discharged, your primary care physician will handle any further medical issues. Please note that NO REFILLS for any discharge medications will be authorized once you are discharged, as it is imperative that you return to your primary care physician (or establish a relationship with a primary care physician if you do not have one) for your aftercare needs so that they can reassess your need for medications and monitor your lab values.     Person Under Monitoring Name: Jordan Logan  Location: [address] [place] [address]   Infection Prevention Recommendations for Individuals Confirmed to have, or Being Evaluated for, 2019 Novel Coronavirus (COVID-19) Infection Who Receive Care at Home  Individuals who are confirmed to have, or are being evaluated for, COVID-19 should follow the prevention steps below until a healthcare provider or local or state health department says they can return to normal activities.  Stay home except to get medical care You should restrict activities outside your home, except for getting medical care. Do not go to work, school,  or public areas, and do not use public transportation or taxis.  Call ahead before visiting your doctor Before your medical appointment, call the healthcare provider and tell them that you have, or are being evaluated for, COVID-19 infection. This will help the healthcare provider's office take steps to keep other people from getting  infected. Ask your healthcare provider to call the local or state health department.  Monitor your symptoms Seek prompt medical attention if your illness is worsening (e.g., difficulty breathing). Before going to your medical appointment, call the healthcare provider and tell them that you have, or are being evaluated for, COVID-19 infection. Ask your healthcare provider to call the local or state health department.  Wear a facemask You should wear a facemask that covers your nose and mouth when you are in the same room with other people and when you visit a healthcare provider. People who live with or visit you should also wear a facemask while they are in the same room with you.  Separate yourself from other people in your home As much as possible, you should stay in a different room from other people in your home. Also, you should use a separate bathroom, if available.  Avoid sharing household items You should not share dishes, drinking glasses, cups, eating utensils, towels, bedding, or other items with other people in your home. After using these items, you should wash them thoroughly with soap and water.  Cover your coughs and sneezes Cover your mouth and nose with a tissue when you cough or sneeze, or you can cough or sneeze into your sleeve. Throw used tissues in a lined trash can, and immediately wash your hands with soap and water for at least 20 seconds or use an alcohol-based hand rub.  Wash your [employer] your hands often and thoroughly with soap and water for at least 20 seconds. You can use an alcohol-based hand sanitizer if soap and water are not available and if your hands are not visibly dirty. Avoid touching your eyes, nose, and mouth with unwashed hands.   Prevention Steps for Caregivers and Household Members of Individuals Confirmed to have, or Being Evaluated for, COVID-19 Infection Being Cared for in the Home  If you live with, or provide care at home for, a  person confirmed to have, or being evaluated for, COVID-19 infection please follow these guidelines to prevent infection:  Follow healthcare provider's instructions Make sure that you understand and can help the patient follow any healthcare provider instructions for all care.  Provide for the patient's basic needs You should help the patient with basic needs in the home and provide support for getting groceries, prescriptions, and other personal needs.  Monitor the patient's symptoms If they are getting sicker, call his or her medical provider and tell them that the patient has, or is being evaluated for, COVID-19 infection. This will help the healthcare provider's office take steps to keep other people from getting infected. Ask the healthcare provider to call the local or state health department.  Limit the number of people who have contact with the patient  If possible, have only one caregiver for the patient.  Other household members should stay in another home or place of residence. If this is not possible, they should stay  in another room, or be separated from the patient as much as possible. Use a separate bathroom, if available.  Restrict visitors who do not have an essential need to be in the home.  Keep older  adults, very young children, and other sick people away from the patient Keep older adults, very young children, and those who have compromised immune systems or chronic health conditions away from the patient. This includes people with chronic heart, lung, or kidney conditions, diabetes, and cancer.  Ensure good ventilation Make sure that shared spaces in the home have good air flow, such as from an air conditioner or an opened window, weather permitting.  Wash your hands often  Wash your hands often and thoroughly with soap and water for at least 20 seconds. You can use an alcohol based hand sanitizer if soap and water are not available and if your hands are not  visibly dirty.  Avoid touching your eyes, nose, and mouth with unwashed hands.  Use disposable paper towels to dry your hands. If not available, use dedicated cloth towels and replace them when they become wet.  Wear a facemask and gloves  Wear a disposable facemask at all times in the room and gloves when you touch or have contact with the patient's blood, body fluids, and/or secretions or excretions, such as sweat, saliva, sputum, nasal mucus, vomit, urine, or feces.  Ensure the mask fits over your nose and mouth tightly, and do not touch it during use.  Throw out disposable facemasks and gloves after using them. Do not reuse.  Wash your hands immediately after removing your facemask and gloves.  If your personal clothing becomes contaminated, carefully remove clothing and launder. Wash your hands after handling contaminated clothing.  Place all used disposable facemasks, gloves, and other waste in a lined container before disposing them with other household waste.  Remove gloves and wash your hands immediately after handling these items.  Do not share dishes, glasses, or other household items with the patient  Avoid sharing household items. You should not share dishes, drinking glasses, cups, eating utensils, towels, bedding, or other items with a patient who is confirmed to have, or being evaluated for, COVID-19 infection.  After the person uses these items, you should wash them thoroughly with soap and water.  Wash laundry thoroughly  Immediately remove and wash clothes or bedding that have blood, body fluids, and/or secretions or excretions, such as sweat, saliva, sputum, nasal mucus, vomit, urine, or feces, on them.  Wear gloves when handling laundry from the patient.  Read and follow directions on labels of laundry or clothing items and detergent. In general, wash and dry with the warmest temperatures recommended on the label.  Clean all areas the individual has used  often  Clean all touchable surfaces, such as counters, tabletops, doorknobs, bathroom fixtures, toilets, phones, keyboards, tablets, and bedside tables, every day. Also, clean any surfaces that may have blood, body fluids, and/or secretions or excretions on them.  Wear gloves when cleaning surfaces the patient has come in contact with.  Use a diluted bleach solution (e.g., dilute bleach with 1 part bleach and 10 parts water) or a household disinfectant with a label that says EPA-registered for coronaviruses. To make a bleach solution at home, add 1 tablespoon of bleach to 1 quart (4 cups) of water. For a larger supply, add  cup of bleach to 1 gallon (16 cups) of water.  Read labels of cleaning products and follow recommendations provided on product labels. Labels contain instructions for safe and effective use of the cleaning product including precautions you should take when applying the product, such as wearing gloves or eye protection and making sure you have good ventilation during use  of the product.  Remove gloves and wash hands immediately after cleaning.  Monitor yourself for signs and symptoms of illness Caregivers and household members are considered close contacts, should monitor their health, and will be asked to limit movement outside of the home to the extent possible. Follow the monitoring steps for close contacts listed on the symptom monitoring form.   ? If you have additional questions, contact your local health department or call the epidemiologist on call at [phone] (available 24/7). ? This guidance is subject to change. For the most up-to-date guidance from [facility], please refer to their website: YouBlogs.pl

## 2020-05-01 NOTE — Progress Notes (Signed)
Pt is refusing medication and also refused lab work to be drawn this am.

## 2020-05-01 NOTE — TOC Transition Note (Signed)
Transition of Care [facility]) - CM/SW Discharge Note   Patient Details  Name: Jordan Logan MRN: [id number] Date of Birth: 07/16/1956  [facility] CM/SW Contact:  Kermit Balo, RN Phone Number: 05/01/2020, 9:24 AM   Clinical Narrative:    Pt is discharging home with self care. Pt doesn't have a PCP but has a card of a physician he is going to call and get set up with.  Pt has transportation home today.    Final next level of care: Home/Self Care Barriers to Discharge: No Barriers Identified   Patient Goals and CMS Choice Patient states their goals for this hospitalization and ongoing recovery are:: To take one step at a time      Discharge Placement                       Discharge Plan and Services                                     Social Determinants of Health (SDOH) Interventions     Readmission Risk Interventions No flowsheet data found.

## 2020-05-01 NOTE — Progress Notes (Signed)
0730- Patient stating that he wants to go home today.  I told patient that once I speak to the doctor then I'll let him know what they said.    0800-  Patient given his morning medications.    Clémence.Cera-  MD ordered a STAT chest x ray.  However, pt refused to have test done.  Lab also came to attempt to draw blood.  Pt refused to have blood drawn. Pt stated he was ready to go.  8366-  Patient seen walking to the front with his mask on to leave hospital.  Pt stated his ride was downstairs.  I told patient that I need to print out his discharge instructions.  Patient also took out his IV on his own.    0900-  Patient refused to have blood pressure rechecked.  Patient stated my ride is downstairs waiting on me and I'm ready to go.  Discharge instructions completed with patient.  Patient understood the information given.

## 2020-06-25 IMAGING — CT CT ANGIO HEAD
2 of 10 series · 7 of 35 positions shown · IV contrast (OMNIPAQUE 350)
Comparison: None.

MR head without contrast 06/27/14

CLINICAL DATA: Diplopia since 3 p.m. yesterday. Personal history of
CVA 1117.



[Series 13: ax thin · axial · 0.40mm/px · z∈[+81,+319]mm · 6 of 335 slices shown]
[im 48/335  soft-tissue]
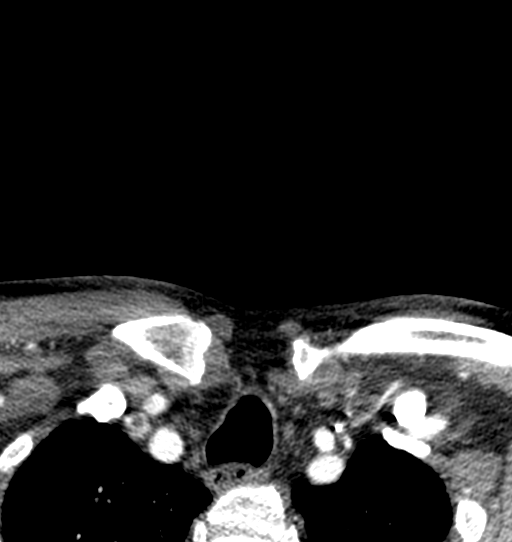
[im 96/335  bone]
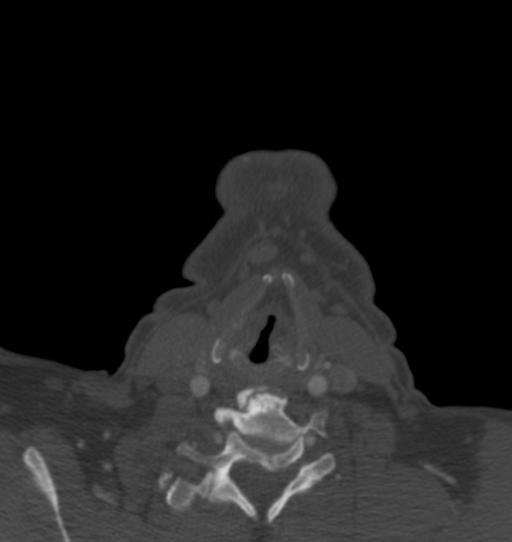
[im 144/335  soft-tissue]
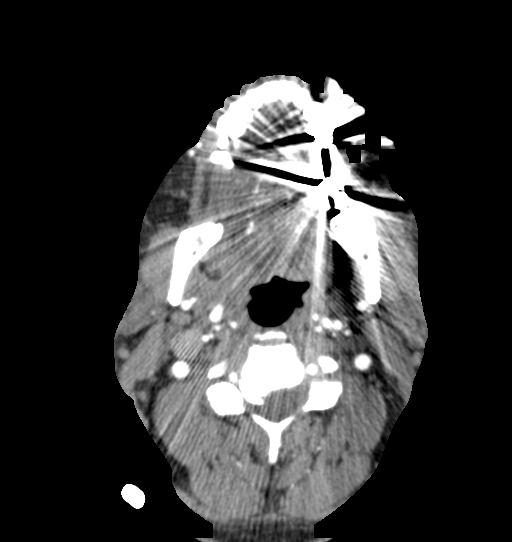
[im 191/335  bone]
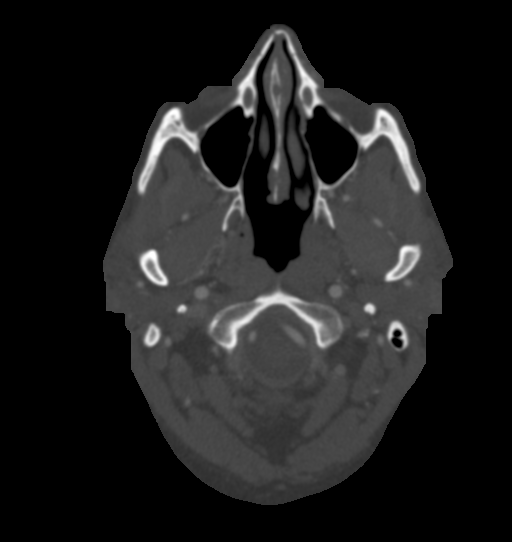
[im 239/335  soft-tissue]
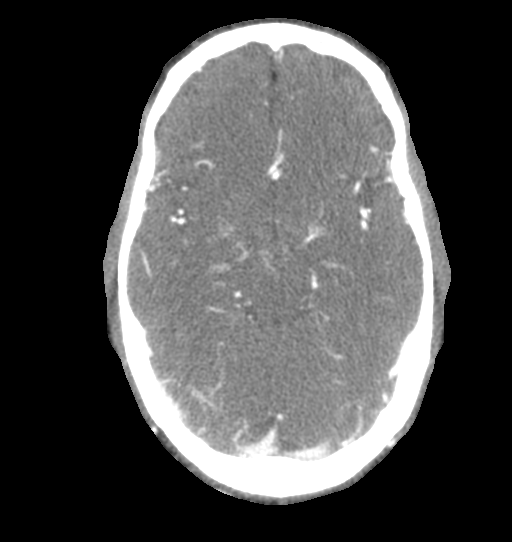
[im 287/335  bone]
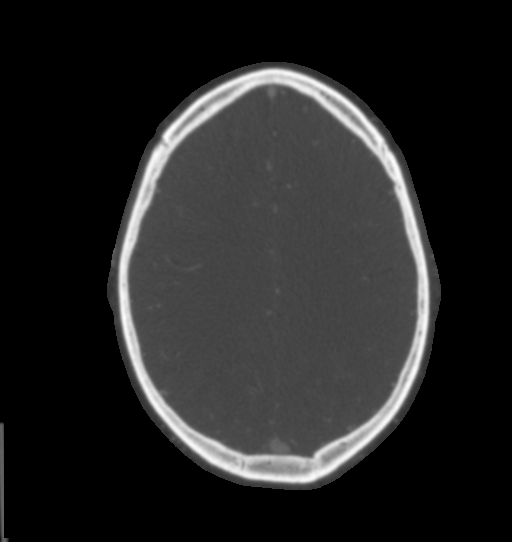

[Series 15: sagittal thin · sagittal · 0.64mm/px · 1 of 255 slices shown]
[im 82/255  soft-tissue]
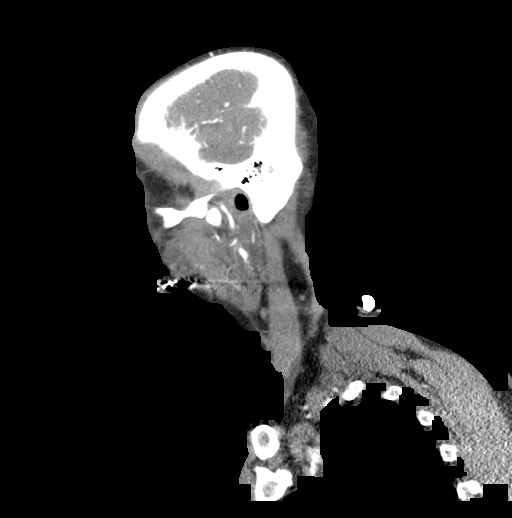

[7 of 35 positions shown; findings below may reference images not displayed]

FINDINGS: Brain: Progressive white matter changes are seen bilaterally, left
greater than right. Lacunar infarct involving the left internal
capsule appears remote. Remote infarct of the left putamen is again
noted. Basal ganglia are otherwise intact. Insular ribbon is normal
bilaterally. Thalami are within normal limits. Brainstem and
cerebellum are normal.

No acute infarct, hemorrhage, or mass lesion is present. The
ventricles are of normal size. No significant extraaxial fluid
collection is present.

Vascular: No hyperdense vessel or unexpected calcification.

Skull: Calvarium is intact. No focal lytic or blastic lesions are
present. No significant extracranial soft tissue lesion is present.

Sinuses: The paranasal sinuses and mastoid air cells are clear.

Orbits: The globes and orbits are within normal limits.

CTA NECK FINDINGS

Aortic arch: Common origin of the left common carotid and innominate
artery is noted, a normal variant. No significant atherosclerotic
disease or stenosis is present.

Right carotid system: The right common carotid artery is within
normal limits. Bifurcation is unremarkable. Cervical right ICA is
normal.

Left carotid system: The left common carotid artery is within normal
limits. Bifurcation is unremarkable. Cervical left ICA is normal.

Vertebral arteries: The left vertebral artery is the dominant
vessel. Both vertebral arteries originate from the subclavian
arteries without significant stenosis. No significant stenosis is
present in either vertebral artery in the neck.

Skeleton: Multilevel degenerative changes are noted. Foraminal
narrowing is greatest on the right at C3-4 and on the left at C6-7.
Vertebral body heights are maintained. No focal lytic or blastic
lesions are present.

Other neck: The soft tissues of the neck are within normal limits.
No significant adenopathy is present. Salivary glands are within
normal limits. Thyroid is unremarkable.

Upper chest: The lung apices are clear.

Review of the MIP images confirms the above findings

CTA HEAD FINDINGS

Anterior circulation: The internal carotid arteries are within
normal limits from the high cervical segments through the ICA
termini bilaterally. The A1 and M1 segments are normal. MCA
bifurcations are within normal limits. The ACA and MCA branch
vessels are normal.

Posterior circulation: The left vertebral artery is dominant.
Dominant AICA vessels are present bilaterally. The basilar artery is
normal. A right scratched at the right posterior cerebral artery
receives equal contribution from the right P1 segment and posterior
communicating artery. Smaller left posterior communicating artery is
present with a dominant left P1 segment. PCA branch vessels are
within normal limits bilaterally.

Venous sinuses: The dural sinuses are patent. The straight sinus and
deep cerebral veins are intact. Cortical veins are within normal
limits.

Anatomic variants: Fetal type right posterior cerebral artery.

Review of the MIP images confirms the above findings
IMPRESSION: 1. Normal variant CTA Circle of Willis without significant proximal
stenosis, aneurysm, or branch vessel occlusion.
2. Normal CTA of the neck.
3. Progressive white matter disease bilaterally, left greater than
right. This may be related to chronic microvascular ischemia.
4. Remote lacunar infarcts of the left internal capsule and left
putamen.
5. Multilevel spondylosis of the cervical spine.

## 2020-06-26 IMAGING — US US RENAL
1 series · 14 of 25 positions shown · non-contrast
Comparison: None.

CLINICAL DATA: Acute kidney injury.

EXAM:
RENAL / URINARY TRACT ULTRASOUND COMPLETE

[Series 1: us renal · 14 of 30 slices shown]
[im 1/30]
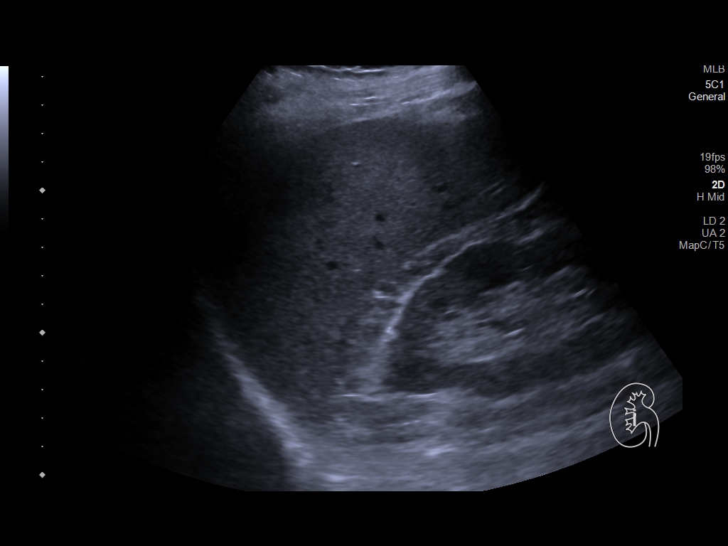
[im 3/30]
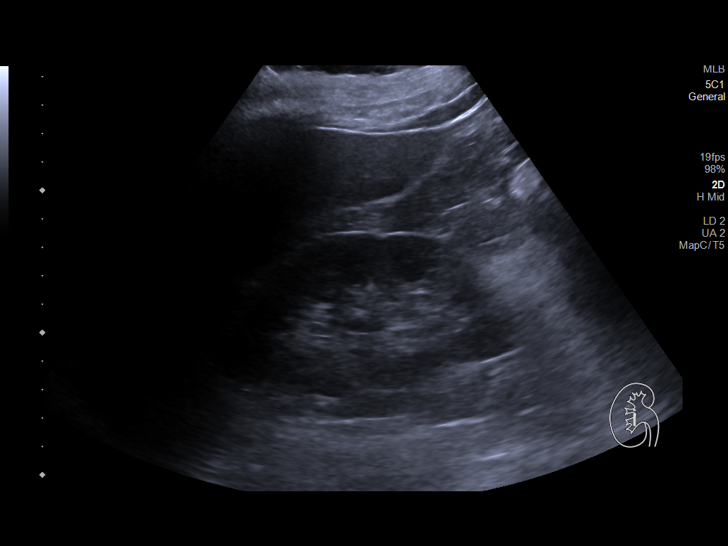
[im 5/30]
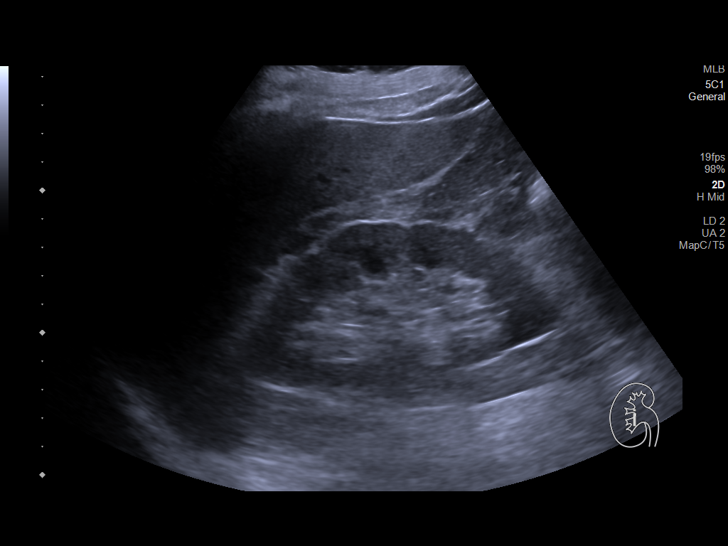
[im 8/30]
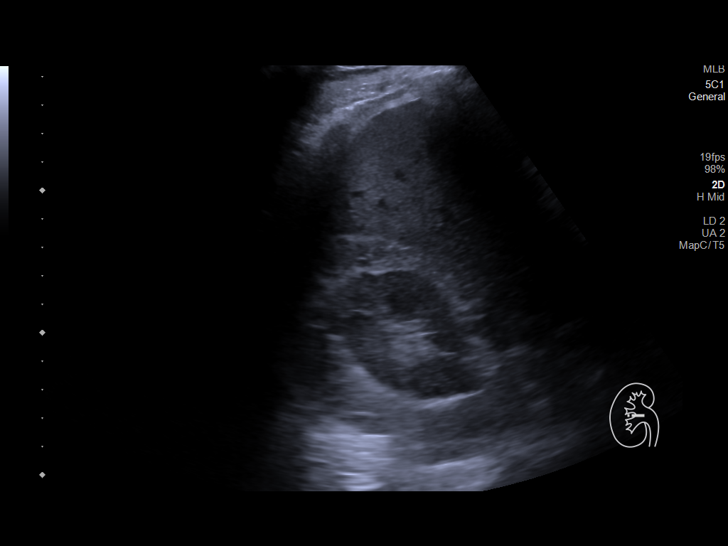
[im 10/30]
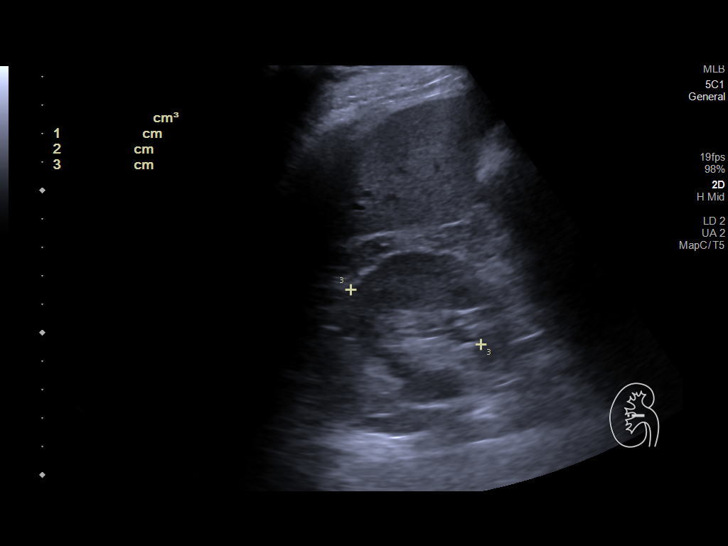
[im 11/30]
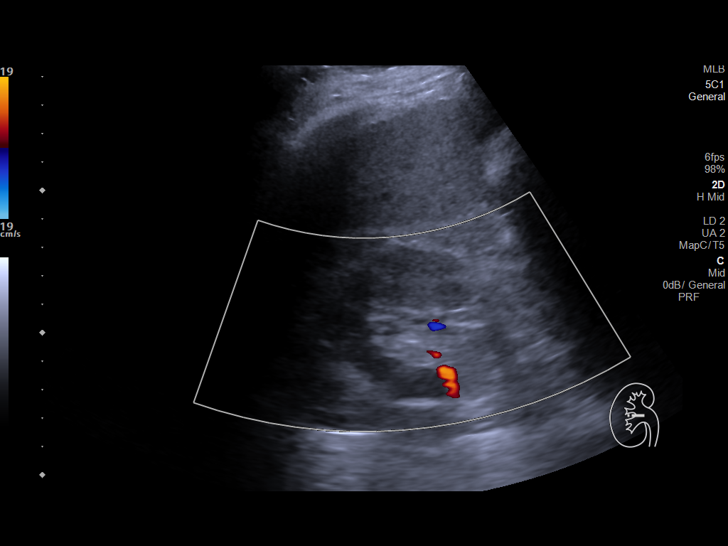
[im 14/30]
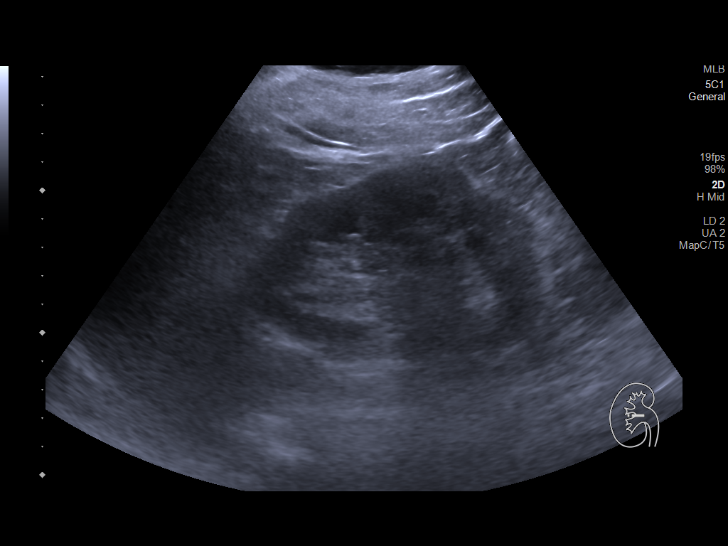
[im 16/30]
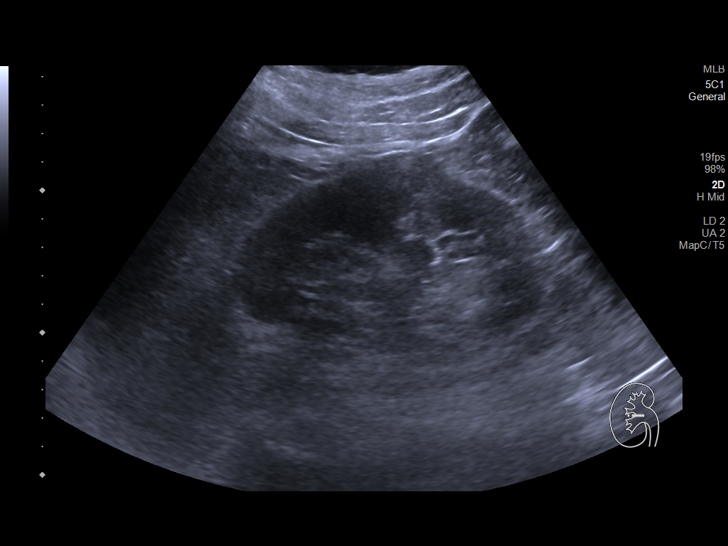
[im 19/30]
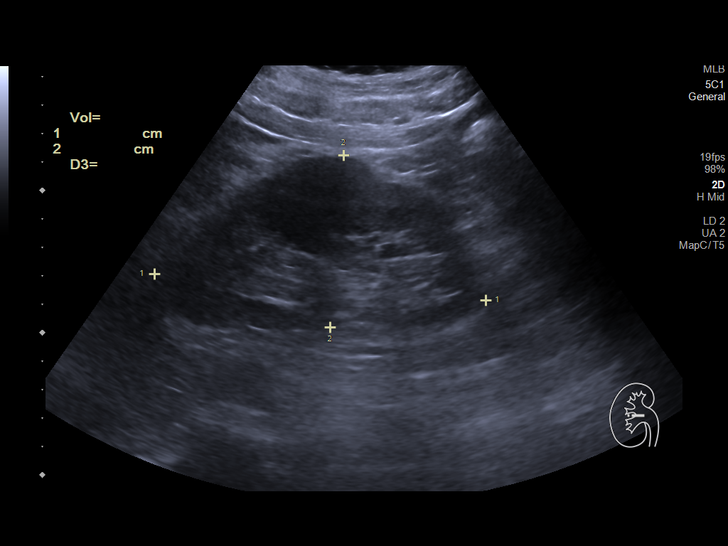
[im 20/30]
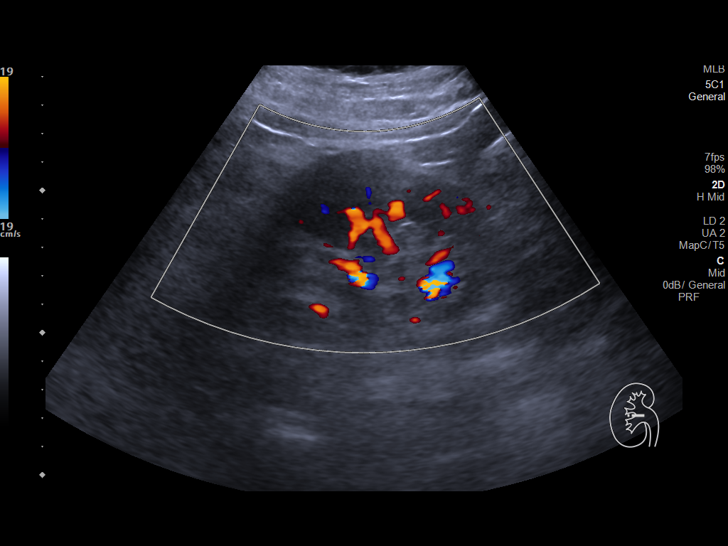
[im 22/30]
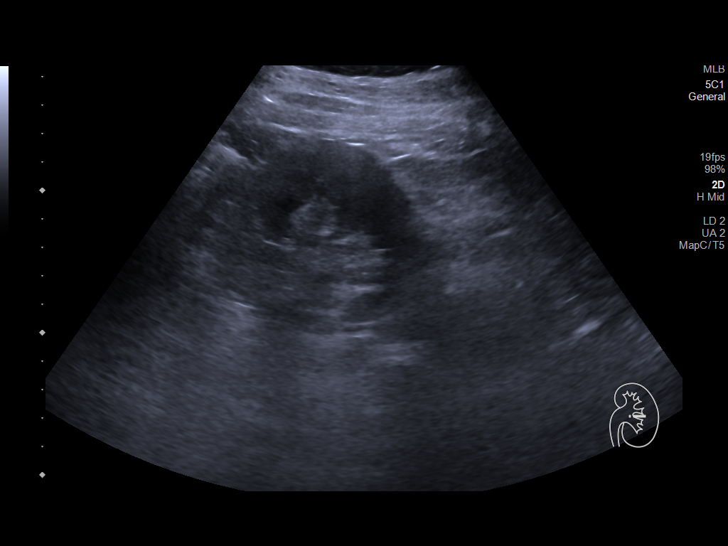
[im 25/30]
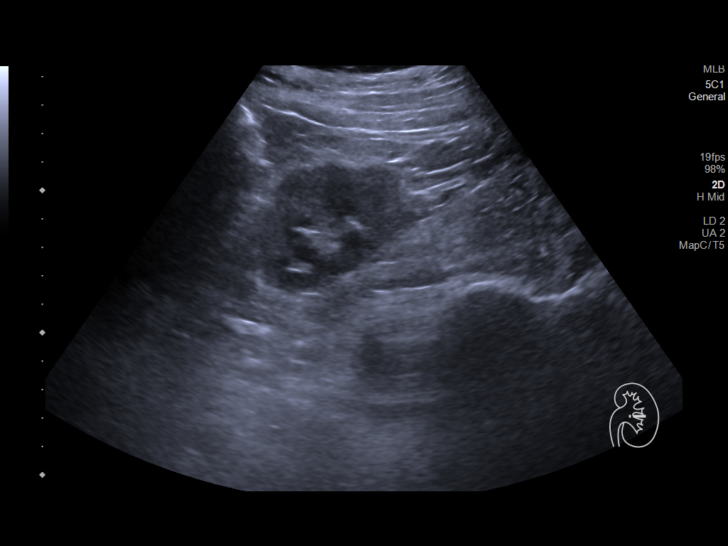
[im 27/30]
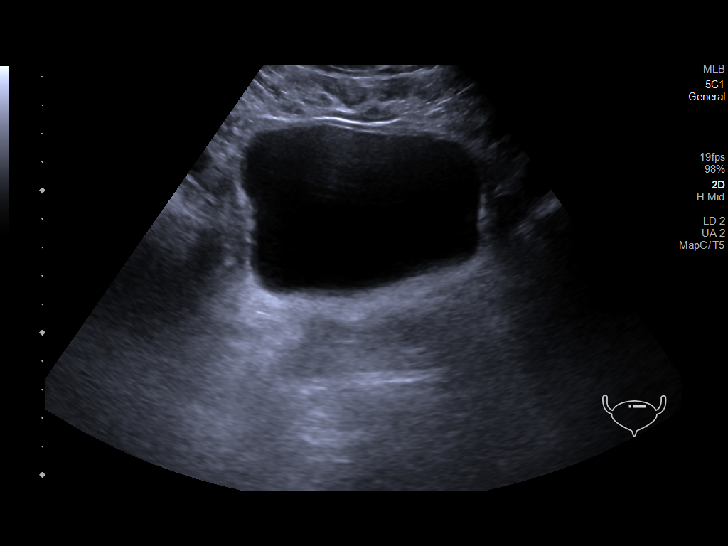
[im 30/30]
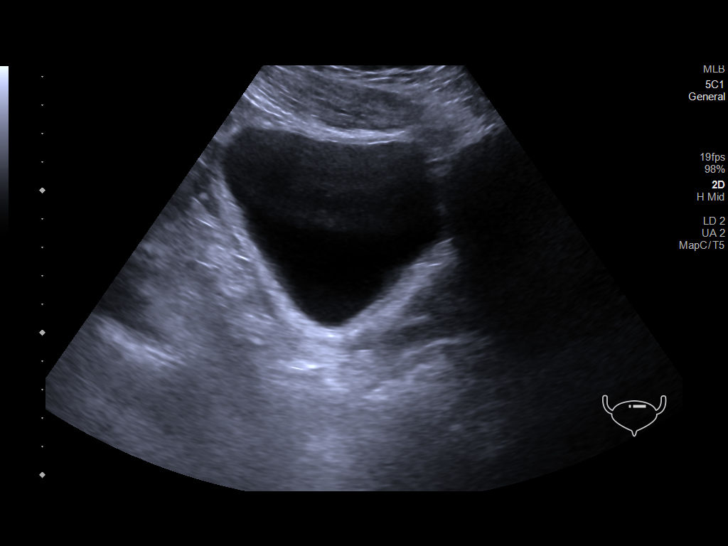

[14 of 25 positions shown; findings below may reference images not displayed]

FINDINGS: Right Kidney:

Renal measurements: 11.7 x 5.9 x 5.0 cm = volume: 178 mL .
Echogenicity within normal limits. No mass or hydronephrosis
visualized.

Left Kidney:

Renal measurements: 11.7 x 6.1 x 5.0 cm = volume: 184 mL.
Echogenicity within normal limits. No mass or hydronephrosis
visualized.

Bladder:

Appears normal for degree of bladder distention.

Other:

None.
IMPRESSION: Normal renal ultrasound.

## 2020-08-14 ENCOUNTER — Other Ambulatory Visit: Payer: Self-pay

## 2020-08-14 ENCOUNTER — Encounter: Payer: Self-pay | Admitting: Internal Medicine

## 2020-08-14 ENCOUNTER — Ambulatory Visit (INDEPENDENT_AMBULATORY_CARE_PROVIDER_SITE_OTHER): Payer: Medicare (Managed Care) | Admitting: Internal Medicine

## 2020-08-14 VITALS — BP 160/120 | HR 79 | Temp 98.0°F | Ht 70.5 in | Wt 230.7 lb

## 2020-08-14 DIAGNOSIS — N171 Acute kidney failure with acute cortical necrosis: Secondary | ICD-10-CM | POA: Diagnosis not present

## 2020-08-14 DIAGNOSIS — E669 Obesity, unspecified: Secondary | ICD-10-CM | POA: Diagnosis not present

## 2020-08-14 DIAGNOSIS — I10 Essential (primary) hypertension: Secondary | ICD-10-CM

## 2020-08-14 DIAGNOSIS — I6389 Other cerebral infarction: Secondary | ICD-10-CM

## 2020-08-14 NOTE — Patient Instructions (Signed)
-Nice seeing you today!!  -Lab work today; will notify you once results are available.  -Schedule follow up in 6 weeks for your physical and for your blood pressure check. Please come in fasting that day.  -low salt diet (see below)   DASH Eating Plan DASH stands for "Dietary Approaches to Stop Hypertension." The DASH eating plan is a healthy eating plan that has been shown to reduce high blood pressure (hypertension). It may also reduce your risk for type 2 diabetes, heart disease, and stroke. The DASH eating plan may also help with weight loss. What are tips for following this plan?  General guidelines  Avoid eating more than 2,300 mg (milligrams) of salt (sodium) a day. If you have hypertension, you may need to reduce your sodium intake to 1,500 mg a day.  Limit alcohol intake to no more than 1 drink a day for nonpregnant women and 2 drinks a day for men. One drink equals 12 oz of beer, 5 oz of wine, or 1 oz of hard liquor.  Work with your health care provider to maintain a healthy body weight or to lose weight. Ask what an ideal weight is for you.  Get at least 30 minutes of exercise that causes your heart to beat faster (aerobic exercise) most days of the week. Activities may include walking, swimming, or biking.  Work with your health care provider or diet and nutrition specialist (dietitian) to adjust your eating plan to your individual calorie needs. Reading food labels   Check food labels for the amount of sodium per serving. Choose foods with less than 5 percent of the Daily Value of sodium. Generally, foods with less than 300 mg of sodium per serving fit into this eating plan.  To find whole grains, look for the word "whole" as the first word in the ingredient list. Shopping  Buy products labeled as "low-sodium" or "no salt added."  Buy fresh foods. Avoid canned foods and premade or frozen meals. Cooking  Avoid adding salt when cooking. Use salt-free seasonings or  herbs instead of table salt or sea salt. Check with your health care provider or pharmacist before using salt substitutes.  Do not fry foods. Cook foods using healthy methods such as baking, boiling, grilling, and broiling instead.  Cook with heart-healthy oils, such as olive, canola, soybean, or sunflower oil. Meal planning  Eat a balanced diet that includes: ? 5 or more servings of fruits and vegetables each day. At each meal, try to fill half of your plate with fruits and vegetables. ? Up to 6-8 servings of whole grains each day. ? Less than 6 oz of lean meat, poultry, or fish each day. A 3-oz serving of meat is about the same size as a deck of cards. One egg equals 1 oz. ? 2 servings of low-fat dairy each day. ? A serving of nuts, seeds, or beans 5 times each week. ? Heart-healthy fats. Healthy fats called Omega-3 fatty acids are found in foods such as flaxseeds and coldwater fish, like sardines, salmon, and mackerel.  Limit how much you eat of the following: ? Canned or prepackaged foods. ? Food that is high in trans fat, such as fried foods. ? Food that is high in saturated fat, such as fatty meat. ? Sweets, desserts, sugary drinks, and other foods with added sugar. ? Full-fat dairy products.  Do not salt foods before eating.  Try to eat at least 2 vegetarian meals each week.  Eat more home-cooked food  and less restaurant, buffet, and fast food.  When eating at a restaurant, ask that your food be prepared with less salt or no salt, if possible. What foods are recommended? The items listed may not be a complete list. Talk with your dietitian about what dietary choices are best for you. Grains Whole-grain or whole-wheat bread. Whole-grain or whole-wheat pasta. Brown rice. Modena Morrow. Bulgur. Whole-grain and low-sodium cereals. Pita bread. Low-fat, low-sodium crackers. Whole-wheat flour tortillas. Vegetables Fresh or frozen vegetables (raw, steamed, roasted, or grilled).  Low-sodium or reduced-sodium tomato and vegetable juice. Low-sodium or reduced-sodium tomato sauce and tomato paste. Low-sodium or reduced-sodium canned vegetables. Fruits All fresh, dried, or frozen fruit. Canned fruit in natural juice (without added sugar). Meat and other protein foods Skinless chicken or [place]. Ground chicken or [place]. Pork with fat trimmed off. Fish and seafood. Egg whites. Dried beans, peas, or lentils. Unsalted nuts, nut butters, and seeds. Unsalted canned beans. Lean cuts of beef with fat trimmed off. Low-sodium, lean deli meat. Dairy Low-fat (1%) or fat-free (skim) milk. Fat-free, low-fat, or reduced-fat cheeses. Nonfat, low-sodium ricotta or cottage cheese. Low-fat or nonfat yogurt. Low-fat, low-sodium cheese. Fats and oils Soft margarine without trans fats. Vegetable oil. Low-fat, reduced-fat, or light mayonnaise and salad dressings (reduced-sodium). Canola, safflower, olive, soybean, and sunflower oils. Avocado. Seasoning and other foods Herbs. Spices. Seasoning mixes without salt. Unsalted popcorn and pretzels. Fat-free sweets. What foods are not recommended? The items listed may not be a complete list. Talk with your dietitian about what dietary choices are best for you. Grains Baked goods made with fat, such as croissants, muffins, or some breads. Dry pasta or rice meal packs. Vegetables Creamed or fried vegetables. Vegetables in a cheese sauce. Regular canned vegetables (not low-sodium or reduced-sodium). Regular canned tomato sauce and paste (not low-sodium or reduced-sodium). Regular tomato and vegetable juice (not low-sodium or reduced-sodium). Angie Fava. Olives. Fruits Canned fruit in a light or heavy syrup. Fried fruit. Fruit in cream or butter sauce. Meat and other protein foods Fatty cuts of meat. Ribs. Fried meat. Berniece Salines. Sausage. Bologna and other processed lunch meats. Salami. Fatback. Hotdogs. Bratwurst. Salted nuts and seeds. Canned beans with added  salt. Canned or smoked fish. Whole eggs or egg yolks. Chicken or [place] with skin. Dairy Whole or 2% milk, cream, and half-and-half. Whole or full-fat cream cheese. Whole-fat or sweetened yogurt. Full-fat cheese. Nondairy creamers. Whipped toppings. Processed cheese and cheese spreads. Fats and oils Butter. Stick margarine. Lard. Shortening. Ghee. Bacon fat. Tropical oils, such as coconut, palm kernel, or palm oil. Seasoning and other foods Salted popcorn and pretzels. Onion salt, garlic salt, seasoned salt, table salt, and sea salt. Worcestershire sauce. Tartar sauce. Barbecue sauce. Teriyaki sauce. Soy sauce, including reduced-sodium. Steak sauce. Canned and packaged gravies. Fish sauce. Oyster sauce. Cocktail sauce. Horseradish that you find on the shelf. Ketchup. Mustard. Meat flavorings and tenderizers. Bouillon cubes. Hot sauce and Tabasco sauce. Premade or packaged marinades. Premade or packaged taco seasonings. Relishes. Regular salad dressings. Where to find more information:  National Heart, Lung, and [place]: https://wilson-eaton.com/  American Heart Association: www.heart.org Summary  The DASH eating plan is a healthy eating plan that has been shown to reduce high blood pressure (hypertension). It may also reduce your risk for type 2 diabetes, heart disease, and stroke.  With the DASH eating plan, you should limit salt (sodium) intake to 2,300 mg a day. If you have hypertension, you may need to reduce your sodium intake to 1,500 mg a  day.  When on the DASH eating plan, aim to eat more fresh fruits and vegetables, whole grains, lean proteins, low-fat dairy, and heart-healthy fats.  Work with your health care provider or diet and nutrition specialist (dietitian) to adjust your eating plan to your individual calorie needs. This information is not intended to replace advice given to you by your health care provider. Make sure you discuss any questions you have with your health care  provider. Document Revised: 10/09/2017 Document Reviewed: 10/20/2016 Elsevier Patient Education  2020 [employer].

## 2020-08-14 NOTE — Progress Notes (Signed)
New Patient Office Visit     This visit occurred during the SARS-CoV-2 public health emergency.  Safety protocols were in place, including screening questions prior to the visit, additional usage of staff PPE, and extensive cleaning of exam room while observing appropriate contact time as indicated for disinfecting solutions.    CC/Reason for Visit: Establish care, discuss chronic conditions Previous PCP: None Last Visit: Unknown  HPI: Jordan Logan is a 64 y.o. male who is coming in today for the above mentioned reasons. Past Medical History is significant for: History of hypertension that has not been well controlled as well as a history of hypertensive hemorrhagic CVA.  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  He was discharged on hydralazine and amlodipine.  He quit taking hydralazine as it caused nausea, after 30 days his amlodipine ran out.  3 weeks ago he went to urgent care and was given lisinopril 10 mg.  His blood pressure is elevated today to 160/120.  During that hospitalization he was incidentally found to have Covid 19 infection.  He does not believe that was true infection.  He declines all vaccines, he is not interested in cancer screening today.   Past Medical/Surgical History: Past Medical History:  Diagnosis Date  . Hypertension   . Stroke [facility](HCC)     Past Surgical History:  Procedure Laterality Date  . NO PAST SURGERIES      Social History:  reports that he has never smoked. He has never used smokeless tobacco. He reports current alcohol use. He reports that he does not use drugs.  Allergies: No Known Allergies  Family History:  Family History  Problem Relation Age of Onset  . Lupus Mother      Current Outpatient Medications:  .  aspirin EC 81 MG tablet, Take 1 tablet (81 mg total) by mouth daily., Disp: 30 tablet, Rfl: 0 .  lisinopril (ZESTRIL) 10 MG tablet, Take 10 mg by mouth daily., Disp: ,  Rfl:  .  amLODipine (NORVASC) 10 MG tablet, Take 1 tablet (10 mg total) by mouth daily. (Patient not taking: Reported on 08/14/2020), Disp: 30 tablet, Rfl: 0 .  hydrALAZINE (APRESOLINE) 100 MG tablet, Take 1 tablet (100 mg total) by mouth every 8 (eight) hours. (Patient not taking: Reported on 08/14/2020), Disp: 90 tablet, Rfl: 0 .  isosorbide mononitrate (IMDUR) 30 MG 24 hr tablet, Take 1 tablet (30 mg total) by mouth daily. (Patient not taking: Reported on 08/14/2020), Disp: 30 tablet, Rfl: 0  Review of Systems:  Constitutional: Denies fever, chills, diaphoresis, appetite change and fatigue.  HEENT: Denies photophobia, eye pain, redness, hearing loss, ear pain, congestion, sore throat, rhinorrhea, sneezing, mouth sores, trouble swallowing, neck pain, neck stiffness and tinnitus.   Respiratory: Denies SOB, DOE, cough, chest tightness,  and wheezing.   Cardiovascular: Denies chest pain, palpitations and leg swelling.  Gastrointestinal: Denies nausea, vomiting, abdominal pain, diarrhea, constipation, blood in stool and abdominal distention.  Genitourinary: Denies dysuria, urgency, frequency, hematuria, flank pain and difficulty urinating.  Endocrine: Denies: hot or cold intolerance, sweats, changes in hair or nails, polyuria, polydipsia. Musculoskeletal: Denies myalgias, back pain, joint swelling, arthralgias and gait problem.  Skin: Denies pallor, rash and wound.  Neurological: Denies dizziness, seizures, syncope, weakness, light-headedness, numbness and headaches.  Hematological: Denies adenopathy. Easy bruising, personal or family bleeding history  Psychiatric/Behavioral: Denies suicidal ideation, mood changes, confusion, nervousness, sleep disturbance and agitation    Physical Exam: Vitals:  08/14/20 1426  BP: (!) 160/120  Pulse: 79  Temp: 98 F (36.7 C)  TempSrc: Oral  SpO2: 95%  Weight: 230 lb 11.2 oz (104.6 kg)  Height: 5' 10.5" (1.791 m)   Body mass index is 32.63  kg/m.  Constitutional: NAD, calm, comfortable Eyes: PERRL, lids and conjunctivae normal ENMT: Mucous membranes are moist.  Respiratory: clear to auscultation bilaterally, no wheezing, no crackles. Normal respiratory effort. No accessory muscle use.  Cardiovascular: Regular rate and rhythm, no murmurs / rubs / gallops. No extremity edema. .  Neurologic: Grossly intact and nonfocal Psychiatric: Normal judgment and insight. Alert and oriented x 3. Normal mood.    Impression and Plan:  Essential hypertension  -Very uncontrolled today to 160/120, he does not do ambulatory measurements. -Increase lisinopril to 20 mg and have him return in 6 weeks for follow-up. -We have talked about a low-sodium diet and he has been provided a handout on the DASH eating plan.  Cerebrovascular accident (CVA) due to other mechanism [facility]) -Due to hypertension, no residual deficits.  Acute renal failure with acute cortical necrosis (HCC) -Suspect this is probably chronic kidney disease, check basic metabolic profile.  Obesity (BMI 30.0-34.9) -Discussed healthy lifestyle, including increased physical activity and better food choices to promote weight loss.     Patient Instructions  -Nice seeing you today!!  -Lab work today; will notify you once results are available.  -Schedule follow up in 6 weeks for your physical and for your blood pressure check. Please come in fasting that day.  -low salt diet (see below)   DASH Eating Plan DASH stands for "Dietary Approaches to Stop Hypertension." The DASH eating plan is a healthy eating plan that has been shown to reduce high blood pressure (hypertension). It may also reduce your risk for type 2 diabetes, heart disease, and stroke. The DASH eating plan may also help with weight loss. What are tips for following this plan?  General guidelines  Avoid eating more than 2,300 mg (milligrams) of salt (sodium) a day. If you have hypertension, you may need to  reduce your sodium intake to 1,500 mg a day.  Limit alcohol intake to no more than 1 drink a day for nonpregnant women and 2 drinks a day for men. One drink equals 12 oz of beer, 5 oz of wine, or 1 oz of hard liquor.  Work with your health care provider to maintain a healthy body weight or to lose weight. Ask what an ideal weight is for you.  Get at least 30 minutes of exercise that causes your heart to beat faster (aerobic exercise) most days of the week. Activities may include walking, swimming, or biking.  Work with your health care provider or diet and nutrition specialist (dietitian) to adjust your eating plan to your individual calorie needs. Reading food labels   Check food labels for the amount of sodium per serving. Choose foods with less than 5 percent of the Daily Value of sodium. Generally, foods with less than 300 mg of sodium per serving fit into this eating plan.  To find whole grains, look for the word "whole" as the first word in the ingredient list. Shopping  Buy products labeled as "low-sodium" or "no salt added."  Buy fresh foods. Avoid canned foods and premade or frozen meals. Cooking  Avoid adding salt when cooking. Use salt-free seasonings or herbs instead of table salt or sea salt. Check with your health care provider or pharmacist before using salt substitutes.  Do  not fry foods. Cook foods using healthy methods such as baking, boiling, grilling, and broiling instead.  Cook with heart-healthy oils, such as olive, canola, soybean, or sunflower oil. Meal planning  Eat a balanced diet that includes: ? 5 or more servings of fruits and vegetables each day. At each meal, try to fill half of your plate with fruits and vegetables. ? Up to 6-8 servings of whole grains each day. ? Less than 6 oz of lean meat, poultry, or fish each day. A 3-oz serving of meat is about the same size as a deck of cards. One egg equals 1 oz. ? 2 servings of low-fat dairy each day. ? A  serving of nuts, seeds, or beans 5 times each week. ? Heart-healthy fats. Healthy fats called Omega-3 fatty acids are found in foods such as flaxseeds and coldwater fish, like sardines, salmon, and mackerel.  Limit how much you eat of the following: ? Canned or prepackaged foods. ? Food that is high in trans fat, such as fried foods. ? Food that is high in saturated fat, such as fatty meat. ? Sweets, desserts, sugary drinks, and other foods with added sugar. ? Full-fat dairy products.  Do not salt foods before eating.  Try to eat at least 2 vegetarian meals each week.  Eat more home-cooked food and less restaurant, buffet, and fast food.  When eating at a restaurant, ask that your food be prepared with less salt or no salt, if possible. What foods are recommended? The items listed may not be a complete list. Talk with your dietitian about what dietary choices are best for you. Grains Whole-grain or whole-wheat bread. Whole-grain or whole-wheat pasta. Brown rice. Orpah Cobb. Bulgur. Whole-grain and low-sodium cereals. Pita bread. Low-fat, low-sodium crackers. Whole-wheat flour tortillas. Vegetables Fresh or frozen vegetables (raw, steamed, roasted, or grilled). Low-sodium or reduced-sodium tomato and vegetable juice. Low-sodium or reduced-sodium tomato sauce and tomato paste. Low-sodium or reduced-sodium canned vegetables. Fruits All fresh, dried, or frozen fruit. Canned fruit in natural juice (without added sugar). Meat and other protein foods Skinless chicken or [place]. Ground chicken or [place]. Pork with fat trimmed off. Fish and seafood. Egg whites. Dried beans, peas, or lentils. Unsalted nuts, nut butters, and seeds. Unsalted canned beans. Lean cuts of beef with fat trimmed off. Low-sodium, lean deli meat. Dairy Low-fat (1%) or fat-free (skim) milk. Fat-free, low-fat, or reduced-fat cheeses. Nonfat, low-sodium ricotta or cottage cheese. Low-fat or nonfat yogurt. Low-fat,  low-sodium cheese. Fats and oils Soft margarine without trans fats. Vegetable oil. Low-fat, reduced-fat, or light mayonnaise and salad dressings (reduced-sodium). Canola, safflower, olive, soybean, and sunflower oils. Avocado. Seasoning and other foods Herbs. Spices. Seasoning mixes without salt. Unsalted popcorn and pretzels. Fat-free sweets. What foods are not recommended? The items listed may not be a complete list. Talk with your dietitian about what dietary choices are best for you. Grains Baked goods made with fat, such as croissants, muffins, or some breads. Dry pasta or rice meal packs. Vegetables Creamed or fried vegetables. Vegetables in a cheese sauce. Regular canned vegetables (not low-sodium or reduced-sodium). Regular canned tomato sauce and paste (not low-sodium or reduced-sodium). Regular tomato and vegetable juice (not low-sodium or reduced-sodium). Rosita Fire. Olives. Fruits Canned fruit in a light or heavy syrup. Fried fruit. Fruit in cream or butter sauce. Meat and other protein foods Fatty cuts of meat. Ribs. Fried meat. Tomasa Blase. Sausage. Bologna and other processed lunch meats. Salami. Fatback. Hotdogs. Bratwurst. Salted nuts and seeds. Canned beans with added  salt. Canned or smoked fish. Whole eggs or egg yolks. Chicken or [place] with skin. Dairy Whole or 2% milk, cream, and half-and-half. Whole or full-fat cream cheese. Whole-fat or sweetened yogurt. Full-fat cheese. Nondairy creamers. Whipped toppings. Processed cheese and cheese spreads. Fats and oils Butter. Stick margarine. Lard. Shortening. Ghee. Bacon fat. Tropical oils, such as coconut, palm kernel, or palm oil. Seasoning and other foods Salted popcorn and pretzels. Onion salt, garlic salt, seasoned salt, table salt, and sea salt. Worcestershire sauce. Tartar sauce. Barbecue sauce. Teriyaki sauce. Soy sauce, including reduced-sodium. Steak sauce. Canned and packaged gravies. Fish sauce. Oyster sauce. Cocktail sauce.  Horseradish that you find on the shelf. Ketchup. Mustard. Meat flavorings and tenderizers. Bouillon cubes. Hot sauce and Tabasco sauce. Premade or packaged marinades. Premade or packaged taco seasonings. Relishes. Regular salad dressings. Where to find more information:  National Heart, Lung, and Blood Institute: PopSteam.is  American Heart Association: www.heart.org Summary  The DASH eating plan is a healthy eating plan that has been shown to reduce high blood pressure (hypertension). It may also reduce your risk for type 2 diabetes, heart disease, and stroke.  With the DASH eating plan, you should limit salt (sodium) intake to 2,300 mg a day. If you have hypertension, you may need to reduce your sodium intake to 1,500 mg a day.  When on the DASH eating plan, aim to eat more fresh fruits and vegetables, whole grains, lean proteins, low-fat dairy, and heart-healthy fats.  Work with your health care provider or diet and nutrition specialist (dietitian) to adjust your eating plan to your individual calorie needs. This information is not intended to replace advice given to you by your health care provider. Make sure you discuss any questions you have with your health care provider. Document Revised: 10/09/2017 Document Reviewed: 10/20/2016 Elsevier Patient Education  2020 Elsevier Inc.      Chaya Jan, MD [facility]

## 2020-08-15 LAB — BASIC METABOLIC PANEL
BUN/Creatinine Ratio: 14 (calc) (ref 6–22)
BUN: 19 mg/dL (ref 7–25)
CO2: 29 mmol/L (ref 20–32)
Calcium: 9.4 mg/dL (ref 8.6–10.3)
Chloride: 105 mmol/L (ref 98–110)
Creat: 1.37 mg/dL — ABNORMAL HIGH (ref 0.70–1.25)
Glucose, Bld: 85 mg/dL (ref 65–99)
Potassium: 3.7 mmol/L (ref 3.5–5.3)
Sodium: 142 mmol/L (ref 135–146)

## 2020-08-17 ENCOUNTER — Telehealth: Payer: Self-pay | Admitting: Internal Medicine

## 2020-08-17 MED ORDER — LISINOPRIL 20 MG PO TABS: 20.0000 mg | ORAL_TABLET | Freq: Every day | ORAL | 3 refills | Status: AC

## 2020-08-17 NOTE — Telephone Encounter (Signed)
I sent in the lisinopril 20 mg to his pharmacy and he needs to make sure he has follow-up appointment with Dr. Ardyth Harps within a few weeks

## 2020-08-17 NOTE — Telephone Encounter (Signed)
Pt is calling in stating that he is out of Rx lisinopril (ZESTRIL) 10 MG  Pharm:  [facility] on [address].

## 2020-08-20 NOTE — Telephone Encounter (Signed)
Unable to leave a message as phone stated the call cannot be completed at this time.

## 2020-08-27 ENCOUNTER — Telehealth: Payer: Self-pay | Admitting: Internal Medicine

## 2020-08-27 NOTE — Telephone Encounter (Signed)
Pt call and stated the new  bp med is not work  For him and he want to go back on labetalol (NORMODYNE) injection 10 mg want it sent to  [facility] [address] [place] - [address] Phone:  [phone]  Fax:  [phone]     he stated he don't want generic he want brand.

## 2020-08-28 NOTE — Telephone Encounter (Signed)
BP meds take 4-6 weeks to exert effect. As discussed at OV, would recommend he continue lisinopril at increased dose and follow up in 6 weeks.

## 2020-08-28 NOTE — Telephone Encounter (Signed)
Patient is aware and will continue his current medication as prescribed.

## 2020-08-29 ENCOUNTER — Encounter: Payer: Self-pay | Admitting: Internal Medicine

## 2020-08-29 DIAGNOSIS — N182 Chronic kidney disease, stage 2 (mild): Secondary | ICD-10-CM | POA: Insufficient documentation

## 2020-09-25 ENCOUNTER — Ambulatory Visit: Payer: Medicare (Managed Care) | Admitting: Internal Medicine

## 2020-11-21 ENCOUNTER — Encounter: Payer: Medicare (Managed Care) | Admitting: Internal Medicine

## 2020-11-21 DIAGNOSIS — Z0289 Encounter for other administrative examinations: Secondary | ICD-10-CM

## 2021-07-29 ENCOUNTER — Telehealth: Payer: Self-pay | Admitting: *Deleted

## 2021-07-29 NOTE — Telephone Encounter (Signed)
Left a message on patients machine to call back about scheduling medicare wellness visit.  

## 2022-03-25 ENCOUNTER — Telehealth: Payer: Self-pay

## 2022-03-25 NOTE — Telephone Encounter (Signed)
Last OV 08/14/20. ?LVM instructions for pt to call back to schedule appt with PCP.  ?

## 2022-04-30 DIAGNOSIS — I10 Essential (primary) hypertension: Secondary | ICD-10-CM | POA: Diagnosis not present

## 2022-04-30 DIAGNOSIS — M7918 Myalgia, other site: Secondary | ICD-10-CM | POA: Diagnosis not present

## 2022-04-30 DIAGNOSIS — I509 Heart failure, unspecified: Secondary | ICD-10-CM | POA: Diagnosis not present

## 2022-04-30 DIAGNOSIS — E669 Obesity, unspecified: Secondary | ICD-10-CM | POA: Diagnosis not present

## 2022-04-30 DIAGNOSIS — Z79899 Other long term (current) drug therapy: Secondary | ICD-10-CM | POA: Diagnosis not present

## 2022-04-30 DIAGNOSIS — E559 Vitamin D deficiency, unspecified: Secondary | ICD-10-CM | POA: Diagnosis not present

## 2022-04-30 DIAGNOSIS — Z1159 Encounter for screening for other viral diseases: Secondary | ICD-10-CM | POA: Diagnosis not present

## 2022-04-30 DIAGNOSIS — N182 Chronic kidney disease, stage 2 (mild): Secondary | ICD-10-CM | POA: Diagnosis not present

## 2022-04-30 DIAGNOSIS — Z0000 Encounter for general adult medical examination without abnormal findings: Secondary | ICD-10-CM | POA: Diagnosis not present

## 2022-04-30 DIAGNOSIS — Z125 Encounter for screening for malignant neoplasm of prostate: Secondary | ICD-10-CM | POA: Diagnosis not present

## 2022-09-05 ENCOUNTER — Other Ambulatory Visit: Payer: Self-pay | Admitting: Nurse Practitioner

## 2022-09-05 ENCOUNTER — Ambulatory Visit
Admission: RE | Admit: 2022-09-05 | Discharge: 2022-09-05 | Disposition: A | Discharge status: Home / Self Care | Payer: Medicare PPO | Source: Ambulatory Visit | Attending: Nurse Practitioner | Admitting: Nurse Practitioner

## 2022-09-05 DIAGNOSIS — M25551 Pain in right hip: Secondary | ICD-10-CM

## 2022-09-05 DIAGNOSIS — M549 Dorsalgia, unspecified: Secondary | ICD-10-CM

## 2024-07-05 ENCOUNTER — Ambulatory Visit
Admission: EM | Admit: 2024-07-05 | Discharge: 2024-07-05 | Disposition: A | Discharge status: Home / Self Care | Attending: Family Medicine | Admitting: Family Medicine

## 2024-07-05 DIAGNOSIS — N182 Chronic kidney disease, stage 2 (mild): Secondary | ICD-10-CM | POA: Diagnosis not present

## 2024-07-05 DIAGNOSIS — I10 Essential (primary) hypertension: Secondary | ICD-10-CM | POA: Diagnosis not present

## 2024-07-05 DIAGNOSIS — Z779 Other contact with and (suspected) exposures hazardous to health: Secondary | ICD-10-CM | POA: Diagnosis not present

## 2024-07-05 MED ORDER — FLUTICASONE PROPIONATE 50 MCG/ACT NA SUSP: 2.0000 | Freq: Every day | NASAL | 0 refills | Status: AC

## 2024-07-05 NOTE — ED Provider Notes (Signed)
 [name]  Note:  This document was prepared using Conservation officer, historic buildings and may include unintentional dictation errors.  MRN: [id number] DOB: Dec 02, 1955  Subjective:   Jordan Logan is a 68 y.o. male presenting for acute onset of a headache last night with sneezing, runny and stuffy nose and irritation about his lymph nodes over the left side.  Symptoms started after he was cleaning his home with Clorox due to having mildew.  Today all of his symptoms have improved and resolved except for some of the irritation along his lymph node of the left side of his head/neck.  No confusion, vision change, active headache in clinic, chest pain, shortness of breath, heart racing, palpitations, nausea, vomiting, abdominal pain.  No weakness, numbness or tingling.  Patient previously had a cerebrovascular accident due to intracerebral hemorrhage.  This was about 10 years ago.  Has CKD stage II.  Does not take any chronic medications.  No Known Allergies  Past Medical History:  Diagnosis Date   Hypertension    Stroke [facility])      Past Surgical History:  Procedure Laterality Date   NO PAST SURGERIES      Family History  Problem Relation Age of Onset   Lupus Mother     Social History   Tobacco Use   Smoking status: Never   Smokeless tobacco: Never  Vaping Use   Vaping status: Never Used  Substance Use Topics   Alcohol use: Not Currently   Drug use: Not Currently    Types: Marijuana    ROS   Objective:   Vitals: BP (!) 166/104 (BP Location: Left Arm) Comment: noncompliant HTN  Pulse 75   Temp 98.7 F (37.1 C) (Oral)   Resp 17   SpO2 97%   Physical Exam Constitutional:      General: He is not in acute distress.    Appearance: Normal appearance. He is well-developed and normal weight. He is not ill-appearing, toxic-appearing or diaphoretic.  HENT:     Head: Normocephalic and atraumatic.     Right Ear: External ear normal.     Left  Ear: External ear normal.     Nose: Nose normal.     Mouth/Throat:     Mouth: Mucous membranes are moist.     Pharynx: Oropharynx is clear. No pharyngeal swelling, oropharyngeal exudate, posterior oropharyngeal erythema or uvula swelling.     Tonsils: No tonsillar exudate or tonsillar abscesses. 0 on the right. 0 on the left.  Eyes:     General: No scleral icterus.       Right eye: No discharge.        Left eye: No discharge.     Extraocular Movements: Extraocular movements intact.  Neck:     Meningeal: Brudzinski's sign and Kernig's sign absent.  Cardiovascular:     Rate and Rhythm: Normal rate and regular rhythm.     Heart sounds: Normal heart sounds. No murmur heard.    No friction rub. No gallop.  Pulmonary:     Effort: Pulmonary effort is normal. No respiratory distress.     Breath sounds: Normal breath sounds. No stridor. No wheezing, rhonchi or rales.  Musculoskeletal:     Cervical back: Normal range of motion.  Lymphadenopathy:     Head:     Right side of head: No submental, submandibular, tonsillar, preauricular, posterior auricular or occipital adenopathy.     Left side of head: Submandibular adenopathy present. No submental, tonsillar, preauricular,  posterior auricular or occipital adenopathy.     Cervical: Cervical adenopathy present.     Right cervical: No superficial, deep or posterior cervical adenopathy.    Left cervical: Superficial cervical adenopathy present. No deep or posterior cervical adenopathy.     Upper Body:     Right upper body: No supraclavicular adenopathy.     Left upper body: No supraclavicular adenopathy.  Neurological:     Mental Status: He is alert and oriented to person, place, and time.     Cranial Nerves: No cranial nerve deficit, dysarthria or facial asymmetry.     Motor: No weakness, abnormal muscle tone or pronator drift.     Coordination: Romberg sign negative. Coordination normal. Finger-Nose-Finger Test and Heel to [employer] Test normal.  Rapid alternating movements normal.     Gait: Gait normal.     Deep Tendon Reflexes: Reflexes normal.  Psychiatric:        Mood and Affect: Mood normal.        Behavior: Behavior normal.        Thought Content: Thought content normal.        Judgment: Judgment normal.     Assessment and Plan :   PDMP not reviewed this encounter.  1. Exposure to respiratory irritant   2. Elevated blood pressure reading with diagnosis of hypertension   3. CKD (chronic kidney disease), stage II    Patient previously had a CVA but reports that this was more than 10 years ago and is not concerned about this now despite his headache yesterday and significantly elevated blood pressure reading.  I did advise that it is possible he is experienced a respiratory irritation and tension headache from using a caustic cleaning agent like Clorox.  He would like to pursue conservative management and wants to use Flonase  only.  Recommended Tylenol  for aches and pains.  Follow-up with PCP for recheck on blood pressure.  Counseled patient on potential for adverse effects with medications prescribed/recommended today, ER and return-to-clinic precautions discussed, patient verbalized understanding.    Jordan Logan, [place] 07/05/24 1136

## 2024-07-05 NOTE — ED Triage Notes (Signed)
 Pt c/o HA, sneezing, runny nose started last night-sx started after cleaning with clorox-states he feels better today after being out of the house-NAD-steady gait

## 2024-07-05 NOTE — Discharge Instructions (Addendum)
 Use Flonase  for the respiratory irritation.  If you change your mind about Zyrtec, you can take 5 mg once daily.  Follow-up with your PCP for recheck on your blood pressure levels.  If they remain elevated then you develop another headache, please present to the emergency room as this may be a sign of a recurrent stroke.

## 2024-08-09 DIAGNOSIS — I10 Essential (primary) hypertension: Secondary | ICD-10-CM | POA: Diagnosis not present

## 2024-10-10 NOTE — Progress Notes (Addendum)
 Jordan Logan                                          MRN: [id number]   10/10/2024   The [facility] Quality Team Specialist reviewed this patient medical record for the purposes of chart review for care gap closure. The following were reviewed: chart review for care gap closure-colorectal cancer screening.  11/24/2024- cannot close col gap 2025    [facility] Quality Team
